# Patient Record
Sex: Female | Born: 2000 | Hispanic: No | Marital: Single | Smoking: Never smoker
Health system: Southern US, Community
[De-identification: ages and names within clinical notes are randomized; demographics above are authoritative.]

## PROBLEM LIST (undated history)

## (undated) DIAGNOSIS — Z789 Other specified health status: Secondary | ICD-10-CM

## (undated) HISTORY — DX: Other specified health status: Z78.9

---
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
## 2019-05-02 ENCOUNTER — Other Ambulatory Visit: Payer: Self-pay | Admitting: *Deleted

## 2019-05-02 DIAGNOSIS — Z20822 Contact with and (suspected) exposure to covid-19: Secondary | ICD-10-CM

## 2019-05-02 NOTE — Progress Notes (Signed)
la 

## 2019-05-05 LAB — NOVEL CORONAVIRUS, NAA: SARS-CoV-2, NAA: DETECTED — AB

## 2020-02-05 DIAGNOSIS — Z20828 Contact with and (suspected) exposure to other viral communicable diseases: Secondary | ICD-10-CM | POA: Diagnosis not present

## 2020-02-05 DIAGNOSIS — Z03818 Encounter for observation for suspected exposure to other biological agents ruled out: Secondary | ICD-10-CM | POA: Diagnosis not present

## 2020-03-20 DIAGNOSIS — Z30013 Encounter for initial prescription of injectable contraceptive: Secondary | ICD-10-CM | POA: Diagnosis not present

## 2020-03-20 DIAGNOSIS — Z3009 Encounter for other general counseling and advice on contraception: Secondary | ICD-10-CM | POA: Diagnosis not present

## 2020-03-20 DIAGNOSIS — Z3202 Encounter for pregnancy test, result negative: Secondary | ICD-10-CM | POA: Diagnosis not present

## 2020-03-20 DIAGNOSIS — Z1388 Encounter for screening for disorder due to exposure to contaminants: Secondary | ICD-10-CM | POA: Diagnosis not present

## 2020-03-20 DIAGNOSIS — Z113 Encounter for screening for infections with a predominantly sexual mode of transmission: Secondary | ICD-10-CM | POA: Diagnosis not present

## 2020-03-20 DIAGNOSIS — Z0389 Encounter for observation for other suspected diseases and conditions ruled out: Secondary | ICD-10-CM | POA: Diagnosis not present

## 2020-04-16 DIAGNOSIS — A5600 Chlamydial infection of lower genitourinary tract, unspecified: Secondary | ICD-10-CM | POA: Diagnosis not present

## 2020-05-29 DIAGNOSIS — Z20822 Contact with and (suspected) exposure to covid-19: Secondary | ICD-10-CM | POA: Diagnosis not present

## 2020-05-29 DIAGNOSIS — Z03818 Encounter for observation for suspected exposure to other biological agents ruled out: Secondary | ICD-10-CM | POA: Diagnosis not present

## 2021-08-20 DIAGNOSIS — Z3009 Encounter for other general counseling and advice on contraception: Secondary | ICD-10-CM | POA: Diagnosis not present

## 2021-08-20 DIAGNOSIS — Z3200 Encounter for pregnancy test, result unknown: Secondary | ICD-10-CM | POA: Diagnosis not present

## 2021-09-09 ENCOUNTER — Ambulatory Visit (INDEPENDENT_AMBULATORY_CARE_PROVIDER_SITE_OTHER): Payer: Medicaid Other

## 2021-09-09 DIAGNOSIS — Z3400 Encounter for supervision of normal first pregnancy, unspecified trimester: Secondary | ICD-10-CM | POA: Insufficient documentation

## 2021-09-09 DIAGNOSIS — Z3402 Encounter for supervision of normal first pregnancy, second trimester: Secondary | ICD-10-CM

## 2021-09-09 MED ORDER — GOJJI WEIGHT SCALE MISC: 1.0000 | 0 refills | Status: DC

## 2021-09-09 MED ORDER — BLOOD PRESSURE KIT DEVI: 1.0000 | 0 refills | Status: DC

## 2021-09-09 NOTE — Progress Notes (Signed)
Patient was assessed and managed by nursing staff during this encounter. I have reviewed the chart and agree with the documentation and plan. I have also made any necessary editorial changes.  Catalina Antigua, MD 09/09/2021 9:21 AM

## 2021-09-09 NOTE — Progress Notes (Signed)
New OB Intake  I connected with  Vanessa Noble on 09/09/21 at  9:00 AM EST by telephone Video Visit and verified that I am speaking with the correct person using two identifiers. Nurse is located at [facility] and pt is located at Home.  I discussed the limitations, risks, security and privacy concerns of performing an evaluation and management service by telephone and the availability of in person appointments. I also discussed with the patient that there may be a patient responsible charge related to this service. The patient expressed understanding and agreed to proceed.  I explained I am completing New OB Intake today. We discussed her EDD of 02/24/22 that is based on LMP of 05/20/21. Pt is G1/P0. I reviewed her allergies, medications, Medical/Surgical/OB history, and appropriate screenings. I informed her of [facility] services. Based on history, this is a/an  pregnancy uncomplicated .   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   MyChart/Babyscripts MyChart access verified. I explained pt will have some visits in office and some virtually. Babyscripts instructions given and order placed. Patient verifies receipt of registration text/e-mail. Account successfully created and app downloaded.  Blood Pressure Cuff  Blood pressure cuff ordered for patient to pick-up from [employer]. Explained after first prenatal appt pt will check weekly and document in Babyscripts.  Weight scale: Patient    have weight scale. Weight scale ordered for patient to pick up form [facility].   Anatomy US Explained first scheduled [place] will be around 19 weeks. Anatomy US will be scheduled at NEW [facility] Provider visit.  Labs Discussed Avelina Laine genetic screening with patient. Would like both Panorama and Horizon drawn at new OB  visit. Routine prenatal labs needed.  Covid Vaccine Patient has covid vaccine.   Centering in Pregnancy Candidate?  If yes, offer as possibility  Mother/ Baby Dyad Candidate?    If yes, offer as possibility  Informed patient of [facility] website  and placed link in her AVS.   Social Determinants of Health Food Insecurity: Patient denies food insecurity. WIC Referral: Patient is interested in referral to [facility].  Transportation: Patient denies transportation needs. Childcare: Discussed no children allowed at ultrasound appointments. Offered childcare services; patient declines childcare services at this time.  Send link to Pregnancy Navigators   Placed OB Box on problem list and updated  First visit review I reviewed new OB appt with pt. I explained she will have a pelvic exam, ob bloodwork with genetic screening, and PAP smear. Explained pt will be seen by Coral Ceo at first visit; encounter routed to appropriate provider. Explained that patient will be seen by pregnancy navigator following visit with provider. [facility] information placed in AVS.   Hamilton Capri, RN 09/09/2021  8:56 AM

## 2021-09-16 ENCOUNTER — Other Ambulatory Visit: Payer: Self-pay

## 2021-09-16 ENCOUNTER — Other Ambulatory Visit (HOSPITAL_COMMUNITY)
Admission: RE | Admit: 2021-09-16 | Discharge: 2021-09-16 | Disposition: A | Discharge status: Home / Self Care | Payer: Medicaid Other | Source: Ambulatory Visit | Attending: Obstetrics | Admitting: Obstetrics

## 2021-09-16 ENCOUNTER — Encounter: Payer: Self-pay | Admitting: Obstetrics

## 2021-09-16 ENCOUNTER — Ambulatory Visit (INDEPENDENT_AMBULATORY_CARE_PROVIDER_SITE_OTHER): Payer: Medicaid Other | Admitting: Obstetrics

## 2021-09-16 ENCOUNTER — Ambulatory Visit (INDEPENDENT_AMBULATORY_CARE_PROVIDER_SITE_OTHER): Payer: Medicaid Other

## 2021-09-16 VITALS — BP 117/80 | HR 90 | Wt 124.3 lb

## 2021-09-16 DIAGNOSIS — O26841 Uterine size-date discrepancy, first trimester: Secondary | ICD-10-CM | POA: Diagnosis not present

## 2021-09-16 DIAGNOSIS — Z3A12 12 weeks gestation of pregnancy: Secondary | ICD-10-CM

## 2021-09-16 DIAGNOSIS — Z3685 Encounter for antenatal screening for Streptococcus B: Secondary | ICD-10-CM | POA: Diagnosis not present

## 2021-09-16 DIAGNOSIS — Z3402 Encounter for supervision of normal first pregnancy, second trimester: Secondary | ICD-10-CM | POA: Diagnosis not present

## 2021-09-16 DIAGNOSIS — Z23 Encounter for immunization: Secondary | ICD-10-CM | POA: Diagnosis not present

## 2021-09-16 DIAGNOSIS — O26842 Uterine size-date discrepancy, second trimester: Secondary | ICD-10-CM

## 2021-09-16 DIAGNOSIS — Z3480 Encounter for supervision of other normal pregnancy, unspecified trimester: Secondary | ICD-10-CM | POA: Diagnosis not present

## 2021-09-16 DIAGNOSIS — Z3482 Encounter for supervision of other normal pregnancy, second trimester: Secondary | ICD-10-CM | POA: Diagnosis not present

## 2021-09-16 DIAGNOSIS — Z3401 Encounter for supervision of normal first pregnancy, first trimester: Secondary | ICD-10-CM | POA: Diagnosis not present

## 2021-09-16 NOTE — Progress Notes (Signed)
Pt presents for NOB visit c/o N&V.  This is not a planned pregnancy but FOB is involved, not living together.

## 2021-09-16 NOTE — Progress Notes (Signed)
Subjective:    Vanessa Noble is being seen today for her first obstetrical visit.  This is not a planned pregnancy. She is at [redacted]w[redacted]d gestation. Her obstetrical history is significant for  none . Relationship with FOB: significant other, not living together but supportive. Patient does intend to breast feed. Pregnancy history fully reviewed.  The information documented in the HPI was reviewed and verified.  Menstrual History: OB History     Gravida  1   Para      Term      Preterm      AB      Living         SAB      IAB      Ectopic      Multiple      Live Births               Patient's last menstrual period was 05/20/2021.    Past Medical History:  Diagnosis Date   Medical history non-contributory     History reviewed. No pertinent surgical history.  (Not in a hospital admission)  Allergies  Allergen Reactions   Penicillins Hives    Social History   Tobacco Use   Smoking status: Never   Smokeless tobacco: Never  Substance Use Topics   Alcohol use: Not Currently    Comment: not since confirmed pregnancy    Family History  Problem Relation Age of Onset   Hypertension Father    Diabetes Father      Review of Systems Constitutional: negative for weight loss Gastrointestinal: negative for vomiting Genitourinary:negative for genital lesions and vaginal discharge and dysuria Musculoskeletal:negative for back pain Behavioral/Psych: negative for abusive relationship, depression, illegal drug usage and tobacco use    Objective:    BP 117/80   Pulse 90   Wt 124 lb 4.8 oz (56.4 kg)   LMP 05/20/2021  General Appearance:    Alert, cooperative, no distress, appears stated age  Head:    Normocephalic, without obvious abnormality, atraumatic  Eyes:    PERRL, conjunctiva/corneas clear, EOM's intact, fundi    benign, both eyes  Ears:    Normal TM's and external ear canals, both ears  Nose:   Nares normal, septum midline, mucosa normal, no  drainage    or sinus tenderness  Throat:   Lips, mucosa, and tongue normal; teeth and gums normal  Neck:   Supple, symmetrical, trachea midline, no adenopathy;    thyroid:  no enlargement/tenderness/nodules; no carotid   bruit or JVD  Back:     Symmetric, no curvature, ROM normal, no CVA tenderness  Lungs:     Clear to auscultation bilaterally, respirations unlabored  Chest Wall:    No tenderness or deformity   Heart:    Regular rate and rhythm, S1 and S2 normal, no murmur, rub   or gallop  Breast Exam:    No tenderness, masses, or nipple abnormality  Abdomen:     Soft, non-tender, bowel sounds active all four quadrants,    no masses, no organomegaly  Genitalia:    Normal female without lesion, discharge or tenderness  Extremities:   Extremities normal, atraumatic, no cyanosis or edema  Pulses:   2+ and symmetric all extremities  Skin:   Skin color, texture, turgor normal, no rashes or lesions  Lymph nodes:   Cervical, supraclavicular, and axillary nodes normal  Neurologic:   CNII-XII intact, normal strength, sensation and reflexes    throughout  Lab Review Urine pregnancy test Labs reviewed yes Radiologic studies reviewed no  I have spent a total of 20 minutes of face-to-face time, excluding clinical staff time, reviewing notes and preparing to see patient, ordering tests and/or medications, and counseling the patient.   Assessment:    1. Encounter for supervision of normal first pregnancy in second trimester Rx: - Genetic Screening - Cervicovaginal ancillary only - CBC/D/Plt+RPR+Rh+ABO+RubIgG... - Culture, OB Urine - [place] MFM OB COMP + 14 WK; Future - Flu Vaccine QUAD 36+ mos IM (Fluarix, Quad PF) - US OB Limited; Future  2. Fundal height low for dates in second trimester Rx: - US OB Limited; Future    Plan:    1. Encounter for supervision of normal first pregnancy in second trimester Rx: - AFP, Serum, Open Spina Bifida - Genetic Screening - Cervicovaginal  ancillary only - CBC/D/Plt+RPR+Rh+ABO+RubIgG... - Culture, OB Urine - [place] MFM OB COMP + 14 WK; Future - Flu Vaccine QUAD 36+ mos IM (Fluarix, Quad PF) - US OB Limited; Future  2. Fundal height low for dates in second trimester Rx: - US OB Limited; Future   Prenatal vitamins.  Counseling provided regarding continued use of seat belts, cessation of alcohol consumption, smoking or use of illicit drugs; infection precautions i.e., influenza/TDAP immunizations, toxoplasmosis,CMV, parvovirus, listeria and varicella; workplace safety, exercise during pregnancy; routine dental care, safe medications, sexual activity, hot tubs, saunas, pools, travel, caffeine use, fish and methlymercury, potential toxins, hair treatments, varicose veins Weight gain recommendations per IOM guidelines reviewed: underweight/BMI< 18.5--> gain 28 - 40 lbs; normal weight/BMI 18.5 - 24.9--> gain 25 - 35 lbs; overweight/BMI 25 - 29.9--> gain 15 - 25 lbs; obese/BMI >30->gain  11 - 20 lbs Problem list reviewed and updated. FIRST/CF mutation testing/NIPT/QUAD SCREEN/fragile X/Ashkenazi Jewish population testing/Spinal muscular atrophy discussed: requested. Role of ultrasound in pregnancy discussed; fetal survey: requested. Amniocentesis discussed: not indicated.   Orders Placed This Encounter  Procedures   Culture, OB Urine   [place] MFM OB COMP + 14 WK    Standing Status:   Future    Standing Expiration Date:   09/16/2022    Order Specific Question:   Reason for Exam (SYMPTOM  OR DIAGNOSIS REQUIRED)    Answer:   anatomy    Order Specific Question:   Preferred Location    Answer:   [facility] Ultrasound   US OB Limited    Standing Status:   Future    Number of Occurrences:   1    Standing Expiration Date:   09/16/2022    Order Specific Question:   Reason for Exam (SYMPTOM  OR DIAGNOSIS REQUIRED)    Answer:   Dating. Fundal height not consistent with LMP    Order Specific Question:   Preferred Imaging Location?    Answer:    External   Flu Vaccine QUAD 36+ mos IM (Fluarix, Quad PF)   AFP, Serum, Open Spina Bifida    Order Specific Question:   Is patient insulin dependent?    Answer:   No    Order Specific Question:   Patient weight (lb.)    Answer:   124lb    Order Specific Question:   Gestational Age (GA), weeks    Answer:   17    Order Specific Question:   Date on which patient was at this GA    Answer:   09/16/2021    Order Specific Question:   GA Calculation Method    Answer:   LMP  Order Specific Question:   Number of fetuses    Answer:   0    Order Specific Question:   Reason for screen    Answer:   OTHER    Comments:   routine     Order Specific Question:   Donor egg?    Answer:   N    Order Specific Question:   Age of egg donor?    Answer:   0   Genetic Screening    PANORAMA   CBC/D/Plt+RPR+Rh+ABO+RubIgG...    Follow up in 4 weeks.   Brock Bad, MD 09/16/2021 3:21 PM

## 2021-09-17 ENCOUNTER — Other Ambulatory Visit: Payer: Self-pay | Admitting: *Deleted

## 2021-09-17 ENCOUNTER — Other Ambulatory Visit (HOSPITAL_COMMUNITY)
Admission: RE | Admit: 2021-09-17 | Discharge: 2021-09-17 | Disposition: A | Discharge status: Home / Self Care | Payer: Medicaid Other | Source: Ambulatory Visit | Attending: Obstetrics | Admitting: Obstetrics

## 2021-09-17 DIAGNOSIS — Z3402 Encounter for supervision of normal first pregnancy, second trimester: Secondary | ICD-10-CM

## 2021-09-17 LAB — CERVICOVAGINAL ANCILLARY ONLY
Bacterial Vaginitis (gardnerella): POSITIVE — AB
Candida Glabrata: NEGATIVE
Candida Vaginitis: NEGATIVE
Chlamydia: NEGATIVE
Comment: NEGATIVE
Comment: NEGATIVE
Comment: NEGATIVE
Comment: NEGATIVE
Comment: NEGATIVE
Comment: NORMAL
Neisseria Gonorrhea: NEGATIVE
Trichomonas: NEGATIVE

## 2021-09-17 NOTE — Progress Notes (Signed)
Lab order entered for pap- wasn't ordered at visit but specimen in lab

## 2021-09-18 LAB — CBC/D/PLT+RPR+RH+ABO+RUBIGG...
Antibody Screen: NEGATIVE
Basophils Absolute: 0 10*3/uL (ref 0.0–0.2)
Basos: 0 %
EOS (ABSOLUTE): 0 10*3/uL (ref 0.0–0.4)
Eos: 0 %
HCV Ab: <0.1 s/co ratio (ref 0.0–0.9)
HIV Screen 4th Generation wRfx: NONREACTIVE
Hematocrit: 37.5 % (ref 34.0–46.6)
Hemoglobin: 12.6 g/dL (ref 11.1–15.9)
Hepatitis B Surface Ag: NEGATIVE
Immature Grans (Abs): 0 10*3/uL (ref 0.0–0.1)
Immature Granulocytes: 0 %
Lymphocytes Absolute: 2.1 10*3/uL (ref 0.7–3.1)
Lymphs: 21 %
MCH: 29.7 pg (ref 26.6–33.0)
MCHC: 33.6 g/dL (ref 31.5–35.7)
MCV: 88 fL (ref 79–97)
Monocytes Absolute: 0.6 10*3/uL (ref 0.1–0.9)
Monocytes: 6 %
Neutrophils Absolute: 7.4 10*3/uL — ABNORMAL HIGH (ref 1.4–7.0)
Neutrophils: 73 %
Platelets: 260 10*3/uL (ref 150–450)
RBC: 4.24 x10E6/uL (ref 3.77–5.28)
RDW: 12.5 % (ref 11.7–15.4)
RPR Ser Ql: NONREACTIVE
Rh Factor: POSITIVE
Rubella Antibodies, IGG: 1.74 index (ref >0.99)
WBC: 10.1 10*3/uL (ref 3.4–10.8)

## 2021-09-18 LAB — AFP, SERUM, OPEN SPINA BIFIDA
AFP MoM: 0.28
AFP Value: 12.7 ng/mL
Gest. Age on Collection Date: 17 weeks
Maternal Age At EDD: 21.7 yr
OSBR Risk 1 IN: 10000
Test Results:: NEGATIVE
Weight: 124 [lb_av]

## 2021-09-18 LAB — URINE CULTURE, OB REFLEX

## 2021-09-18 LAB — HCV INTERPRETATION

## 2021-09-18 LAB — CULTURE, OB URINE

## 2021-09-22 ENCOUNTER — Other Ambulatory Visit: Payer: Self-pay | Admitting: Obstetrics

## 2021-09-22 DIAGNOSIS — B9689 Other specified bacterial agents as the cause of diseases classified elsewhere: Secondary | ICD-10-CM

## 2021-09-22 LAB — CYTOLOGY - PAP

## 2021-09-22 MED ORDER — METRONIDAZOLE 500 MG PO TABS: 500.0000 mg | ORAL_TABLET | Freq: Two times a day (BID) | ORAL | 2 refills | Status: DC

## 2021-09-24 ENCOUNTER — Encounter: Payer: Self-pay | Admitting: Obstetrics and Gynecology

## 2021-09-24 ENCOUNTER — Telehealth: Payer: Self-pay

## 2021-09-24 NOTE — Telephone Encounter (Addendum)
Spoke with pt and let her know that pap showed LGSIL and that she needed to schedule a Colposcopy. Pt states she spoke to office staff yesterday and was told she didn't need the Colposcopy and that she could just repeat the pap smear in a year. I don't see any documentation of this so I have sent staff message to Charlsie Quest, CMA (who previously attempted to call her) and Dr.Harper to get clarification on this.   ----- Message from Brock Bad, MD sent at 09/23/2021  8:55 AM EST ----- LGSIL pap smear.  Please schedule colposcopy.

## 2021-09-26 ENCOUNTER — Encounter: Payer: Self-pay | Admitting: Obstetrics and Gynecology

## 2021-09-26 DIAGNOSIS — R87612 Low grade squamous intraepithelial lesion on cytologic smear of cervix (LGSIL): Secondary | ICD-10-CM | POA: Insufficient documentation

## 2021-10-15 ENCOUNTER — Other Ambulatory Visit: Payer: Self-pay

## 2021-10-15 ENCOUNTER — Ambulatory Visit (INDEPENDENT_AMBULATORY_CARE_PROVIDER_SITE_OTHER): Payer: Medicaid Other | Admitting: Women's Health

## 2021-10-15 VITALS — BP 117/64 | HR 79 | Wt 124.0 lb

## 2021-10-15 DIAGNOSIS — Z3A17 17 weeks gestation of pregnancy: Secondary | ICD-10-CM

## 2021-10-15 DIAGNOSIS — Z3402 Encounter for supervision of normal first pregnancy, second trimester: Secondary | ICD-10-CM

## 2021-10-15 DIAGNOSIS — R87612 Low grade squamous intraepithelial lesion on cytologic smear of cervix (LGSIL): Secondary | ICD-10-CM

## 2021-10-15 NOTE — Patient Instructions (Signed)
Maternity Assessment Unit (MAU)  The Maternity Assessment Unit (MAU) is located at the [facility] [address] [facility]. The address is: [address] [place], [place], [place] [address]. Please see map below for additional directions.    The Maternity Assessment Unit is designed to help you during your pregnancy, and for up to 6 weeks after delivery, with any pregnancy- or postpartum-related emergencies, if you think you are in labor, or if your water has broken. For example, if you experience nausea and vomiting, vaginal bleeding, severe abdominal or pelvic pain, elevated blood pressure or other problems related to your pregnancy or postpartum time, please come to the Maternity Assessment Unit for assistance.       Second Trimester of Pregnancy The second trimester of pregnancy is from week 13 through week 27. This is months 4 through 6 of pregnancy. The second trimester is often a time when you feel your best. Your body has adjusted to being pregnant, and you begin to feel better physically. During the second trimester: Morning sickness has lessened or stopped completely. You may have more energy. You may have an increase in appetite. The second trimester is also a time when the unborn baby (fetus) is growing rapidly. At the end of the sixth month, the fetus may be up to 12 inches long and weigh about 1 pounds. You will likely begin to feel the baby move (quickening) between 16 and 20 weeks of pregnancy. Body changes during your second trimester Your body continues to go through many changes during your second trimester. The changes vary and generally return to normal after the baby is born. Physical changes Your weight will continue to increase. You will notice your lower abdomen bulging out. You may begin to get stretch marks on your hips, abdomen, and breasts. Your breasts will continue to grow and to become tender. Dark spots or blotches (chloasma or  mask of pregnancy) may develop on your face. A dark line from your belly button to the pubic area (linea nigra) may appear. You may have changes in your hair. These can include thickening of your hair, rapid growth, and changes in texture. Some people also have hair loss during or after pregnancy, or hair that feels dry or thin. Health changes You may develop headaches. You may have heartburn. You may develop constipation. You may develop hemorrhoids or swollen, bulging veins (varicose veins). Your gums may bleed and may be sensitive to brushing and flossing. You may urinate more often because the fetus is pressing on your bladder. You may have back pain. This is caused by: Weight gain. Pregnancy hormones that are relaxing the joints in your pelvis. A shift in weight and the muscles that support your balance. Follow these instructions at home: Medicines Follow your health care provider's instructions regarding medicine use. Specific medicines may be either safe or unsafe to take during pregnancy. Do not take any medicines unless approved by your health care provider. Take a prenatal vitamin that contains at least 600 micrograms (mcg) of folic acid. Eating and drinking Eat a healthy diet that includes fresh fruits and vegetables, whole grains, good sources of protein such as meat, eggs, or tofu, and low-fat dairy products. Avoid raw meat and unpasteurized juice, milk, and cheese. These carry germs that can harm you and your baby. You may need to take these actions to prevent or treat constipation: Drink enough fluid to keep your urine pale yellow. Eat foods that are high in fiber, such  as beans, whole grains, and fresh fruits and vegetables. Limit foods that are high in fat and processed sugars, such as fried or sweet foods. Activity Exercise only as directed by your health care provider. Most people can continue their usual exercise routine during pregnancy. Try to exercise for 30 minutes  at least 5 days a week. Stop exercising if you develop contractions in your uterus. Stop exercising if you develop pain or cramping in the lower abdomen or lower back. Avoid exercising if it is very hot or humid or if you are at a high altitude. Avoid heavy lifting. If you choose to, you may have sex unless your health care provider tells you not to. Relieving pain and discomfort Wear a supportive bra to prevent discomfort from breast tenderness. Take warm sitz baths to soothe any pain or discomfort caused by hemorrhoids. Use hemorrhoid cream if your health care provider approves. Rest with your legs raised (elevated) if you have leg cramps or low back pain. If you develop varicose veins: Wear support hose as told by your health care provider. Elevate your feet for 15 minutes, 3-4 times a day. Limit salt in your diet. Safety Wear your seat belt at all times when driving or riding in a car. Talk with your health care provider if someone is verbally or physically abusive to you. Lifestyle Do not use hot tubs, steam rooms, or saunas. Do not douche. Do not use tampons or scented sanitary pads. Avoid cat litter boxes and soil used by cats. These carry germs that can cause birth defects in the baby and possibly loss of the fetus by miscarriage or stillbirth. Do not use herbal remedies, alcohol, illegal drugs, or medicines that are not approved by your health care provider. Chemicals in these products can harm your baby. Do not use any products that contain nicotine or tobacco, such as cigarettes, e-cigarettes, and chewing tobacco. If you need help quitting, ask your health care provider. General instructions During a routine prenatal visit, your health care provider will do a physical exam and other tests. He or she will also discuss your overall health. Keep all follow-up visits. This is important. Ask your health care provider for a referral to a local prenatal education class. Ask for help if  you have counseling or nutritional needs during pregnancy. Your health care provider can offer advice or refer you to specialists for help with various needs. Where to find more information American Pregnancy Association: americanpregnancy.org [employer] of [employer]stetricians and Gynecologists: https://www.todd-brady.net/ Office on [employer] Health: MightyReward.co.nz Contact a health care provider if you have: A headache that does not go away when you take medicine. Vision changes or you see spots in front of your eyes. Mild pelvic cramps, pelvic pressure, or nagging pain in the abdominal area. Persistent nausea, vomiting, or diarrhea. A bad-smelling vaginal discharge or foul-smelling urine. Pain when you urinate. Sudden or extreme swelling of your face, hands, ankles, feet, or legs. A fever. Get help right away if you: Have fluid leaking from your vagina. Have spotting or bleeding from your vagina. Have severe abdominal cramping or pain. Have difficulty breathing. Have chest pain. Have fainting spells. Have not felt your baby move for the time period told by your health care provider. Have new or increased pain, swelling, or redness in an arm or leg. Summary The second trimester of pregnancy is from week 13 through week 27 (months 4 through 6). Do not use herbal remedies, alcohol, illegal drugs, or medicines that are not  approved by your health care provider. Chemicals in these products can harm your baby. Exercise only as directed by your health care provider. Most people can continue their usual exercise routine during pregnancy. Keep all follow-up visits. This is important. This information is not intended to replace advice given to you by your health care provider. Make sure you discuss any questions you have with your health care provider. Document Revised: 03/20/2020 Document Reviewed: 01/25/2020 Elsevier Patient Education  2022 [employer].       Contraception Choices Contraception, also called birth control, refers to methods or devices that prevent pregnancy. Hormonal methods Contraceptive implant A contraceptive implant is a thin, plastic tube that contains a hormone that prevents pregnancy. It is different from an intrauterine device (IUD). It is inserted into the upper part of the arm by a health care provider. Implants can be effective for up to 3 years. Progestin-only injections Progestin-only injections are injections of progestin, a synthetic form of the hormone progesterone. They are given every 3 months by a health care provider. Birth control pills Birth control pills are pills that contain hormones that prevent pregnancy. They must be taken once a day, preferably at the same time each day. A prescription is needed to use this method of contraception. Birth control patch The birth control patch contains hormones that prevent pregnancy. It is placed on the skin and must be changed once a week for three weeks and removed on the fourth week. A prescription is needed to use this method of contraception. Vaginal ring A vaginal ring contains hormones that prevent pregnancy. It is placed in the vagina for three weeks and removed on the fourth week. After that, the process is repeated with a new ring. A prescription is needed to use this method of contraception. Emergency contraceptive Emergency contraceptives prevent pregnancy after unprotected sex. They come in pill form and can be taken up to 5 days after sex. They work best the sooner they are taken after having sex. Most emergency contraceptives are available without a prescription. This method should not be used as your only form of birth control. Barrier methods Female condom A female condom is a thin sheath that is worn over the penis during sex. Condoms keep sperm from going inside a woman's body. They can be used with a sperm-killing substance (spermicide) to  increase their effectiveness. They should be thrown away after one use. Female condom A female condom is a soft, loose-fitting sheath that is put into the vagina before sex. The condom keeps sperm from going inside a woman's body. They should be thrown away after one use. Diaphragm A diaphragm is a soft, dome-shaped barrier. It is inserted into the vagina before sex, along with a spermicide. The diaphragm blocks sperm from entering the uterus, and the spermicide kills sperm. A diaphragm should be left in the vagina for 6-8 hours after sex and removed within 24 hours. A diaphragm is prescribed and fitted by a health care provider. A diaphragm should be replaced every 1-2 years, after giving birth, after gaining more than 15 lb (6.8 kg), and after pelvic surgery. Cervical cap A cervical cap is a round, soft latex or plastic cup that fits over the cervix. It is inserted into the vagina before sex, along with spermicide. It blocks sperm from entering the uterus. The cap should be left in place for 6-8 hours after sex and removed within 48 hours. A cervical cap must be prescribed and fitted by a health care  provider. It should be replaced every 2 years. Sponge A sponge is a soft, circular piece of polyurethane foam with spermicide in it. The sponge helps block sperm from entering the uterus, and the spermicide kills sperm. To use it, you make it wet and then insert it into the vagina. It should be inserted before sex, left in for at least 6 hours after sex, and removed and thrown away within 30 hours. Spermicides Spermicides are chemicals that kill or block sperm from entering the cervix and uterus. They can come as a cream, jelly, suppository, foam, or tablet. A spermicide should be inserted into the vagina with an applicator at least 10-15 minutes before sex to allow time for it to work. The process must be repeated every time you have sex. Spermicides do not require a prescription. Intrauterine  contraception Intrauterine device (IUD) An IUD is a T-shaped device that is put in a woman's uterus. There are two types: Hormone IUD.This type contains progestin, a synthetic form of the hormone progesterone. This type can stay in place for 3-5 years. Copper IUD.This type is wrapped in copper wire. It can stay in place for 10 years. Permanent methods of contraception Female tubal ligation In this method, a woman's fallopian tubes are sealed, tied, or blocked during surgery to prevent eggs from traveling to the uterus. Hysteroscopic sterilization In this method, a small, flexible insert is placed into each fallopian tube. The inserts cause scar tissue to form in the fallopian tubes and block them, so sperm cannot reach an egg. The procedure takes about 3 months to be effective. Another form of birth control must be used during those 3 months. Female sterilization This is a procedure to tie off the tubes that carry sperm (vasectomy). After the procedure, the man can still ejaculate fluid (semen). Another form of birth control must be used for 3 months after the procedure. Natural planning methods Natural family planning In this method, a couple does not have sex on days when the woman could become pregnant. Calendar method In this method, the woman keeps track of the length of each menstrual cycle, identifies the days when pregnancy can happen, and does not have sex on those days. Ovulation method In this method, a couple avoids sex during ovulation. Symptothermal method This method involves not having sex during ovulation. The woman typically checks for ovulation by watching changes in her temperature and in the consistency of cervical mucus. Post-ovulation method In this method, a couple waits to have sex until after ovulation. Where to find more information Centers for Disease Control and Prevention: FootballExhibition.com.br Summary Contraception, also called birth control, refers to methods or devices  that prevent pregnancy. Hormonal methods of contraception include implants, injections, pills, patches, vaginal rings, and emergency contraceptives. Barrier methods of contraception can include female condoms, female condoms, diaphragms, cervical caps, sponges, and spermicides. There are two types of IUDs (intrauterine devices). An IUD can be put in a woman's uterus to prevent pregnancy for 3-5 years. Permanent sterilization can be done through a procedure for males and females. Natural family planning methods involve nothaving sex on days when the woman could become pregnant. This information is not intended to replace advice given to you by your health care provider. Make sure you discuss any questions you have with your health care provider. Document Revised: 03/18/2020 Document Reviewed: 03/18/2020 Elsevier Patient Education  2022 Elsevier Inc.       AREA PEDIATRIC/FAMILY PRACTICE PHYSICIANS  [facility] [address]  [address] [place], [place] [address] Phone - [phone]   Fax - [phone]  JACK AMOS [address] [place], [place]  [address] Phone - [phone]   Fax - [phone]  [facility] [address] [place], [place]  [address] Phone - [phone]   Fax - [phone]  [facility] [address] [place], [place]  [address] Phone - [phone]   Fax - [phone]  [facility] [address] [place], [place]  [address] Phone - [phone]   Fax - [phone]  [facility] [address] [place], [place]  [address] Phone - [phone]   Fax - [phone]  [facility] [place] [address] [place], [place]  [address] Phone - [phone]   Fax - [phone]  [facility] AT [facility] [address] [place], [address] [place], [place]  [address] Phone - [phone]   Fax - [phone]  [facility]  AT [facility] [address] [place], [place]  [address] Phone - [phone]   Fax - [phone] [facility] AT [place] [address] [place], [place]  [address] Phone - [phone]   Fax - [phone]  [facility] [address] [place], [place]  [address] Phone - [phone]   Fax - [phone]  [facility] [address], [address] [place], [place]  [address] Phone - [phone]   Fax - [phone]  [facility] [place] [address] [place], [place]  [address] Phone - [phone]   Fax - [phone]  [facility] [address] [place], [place]  [address] Phone - [phone]  [facility] [address] [place], [place]  [address] Phone - [phone]   Fax - [phone]  [facility] [address] [place], [place]  [address] Phone - [phone]   Fax - [phone]  [facility] [address] [place], [place]  [address] Phone - [phone]   Fax - [phone]  [facility] [address] [place], [place]  [address] Phone - [phone]   Fax - [phone]   [facility] [address] [place] [address] [place], [place]  [address] Phone - [phone]   Fax - [phone]  [facility] [address] [place], [place]  [address] Phone - [phone]   Fax - [phone]  DAVID RUBIN [address] [place], [place]  [address] Phone - [phone]   Fax - [phone]  [facility] [address] [place], [place]  [address] Phone - [phone]   Fax - [phone]  [place] [facility] Alita Chyle [address] [place], [place]  [address] Phone - [phone]   Fax - [phone] Gerarda Fraction [address] [place], [place]  [address] Phone - [phone]   Fax - [phone]  [facility]  [place] [address] [place], [place]  [address] Phone - [phone]   Fax - [phone]  [facility] - [place] [address] [place], [place]  [address] Phone - [phone]   Fax - [phone]        Childbirth Education Options: [facility] Department Classes:  Childbirth education classes can help you get ready for a positive parenting experience. You can also meet other expectant parents and get free stuff for your baby. Each class runs for  five weeks on the same night and costs $45 for the mother-to-be and her support person. Medicaid covers the cost if you are eligible. Call [phone] to register. [facility] Childbirth Education: Classes can vary in availability and schedule is subject to change. For most up-to-date information please visit [email or web address] to review and register.

## 2021-10-15 NOTE — Progress Notes (Signed)
Subjective:  Vanessa Noble is a 20 y.o. G1P0 at [redacted]w[redacted]d being seen today for ongoing prenatal care.  She is currently monitored for the following issues for this low-risk pregnancy and has Dysmenorrhea in adolescent; Acne vulgaris; Dyshydrosis; Supervision of normal first pregnancy; and LGSIL on Pap smear of cervix on their problem list.  Patient reports no complaints.  Contractions: Not present. Vag. Bleeding: None.   . Denies leaking of fluid.   The following portions of the patient's history were reviewed and updated as appropriate: allergies, current medications, past family history, past medical history, past social history, past surgical history and problem list. Problem list updated.  Objective:   Vitals:   10/15/21 0931  BP: 117/64  Pulse: 79  Weight: 124 lb (56.2 kg)    Fetal Status: Fetal Heart Rate (bpm): 160         General:  Alert, oriented and cooperative. Patient is in no acute distress.  Skin: Skin is warm and dry. No rash noted.   Cardiovascular: Normal heart rate noted  Respiratory: Normal respiratory effort, no problems with respiration noted  Abdomen: Soft, gravid, appropriate for gestational age. Pain/Pressure: Present     Pelvic: Vag. Bleeding: None     Cervical exam deferred        Extremities: Normal range of motion.  Edema: None  Mental Status: Normal mood and affect. Normal behavior. Normal judgment and thought content.   Urinalysis:      Assessment and Plan:  Pregnancy: G1P0 at 110w0d  1. Encounter for supervision of normal first pregnancy in second trimester -info given on contraception -peds list given -CBE info given  PHQ9 SCORE ONLY 09/09/2021  PHQ-9 Total Score 1   GAD 7 : Generalized Anxiety Score 09/09/2021  Nervous, Anxious, on Edge 0  Control/stop worrying 0  Worry too much - different things 0  Trouble relaxing 0  Restless 0  Easily annoyed or irritable 0  Afraid - awful might happen 0  Total GAD 7 Score 0   2. LGSIL on Pap  smear of cervix -repeat one year  3. [redacted] weeks gestation of pregnancy  Preterm labor symptoms and general obstetric precautions including but not limited to vaginal bleeding, contractions, leaking of fluid and fetal movement were reviewed in detail with the patient. I discussed the assessment and treatment plan with the patient. The patient was provided an opportunity to ask questions and all were answered. The patient agreed with the plan and demonstrated an understanding of the instructions. The patient was advised to call back or seek an in-person office evaluation/go to [facility] at [facility] for any urgent or concerning symptoms. Please refer to After Visit Summary for other counseling recommendations.  Return in about 4 weeks (around 11/12/2021) for in-person LOB/APP OK.   [place]anyon Mcginness, Odie Sera, NP

## 2021-10-26 NOTE — L&D Delivery Note (Cosign Needed)
OB/GYN Faculty Practice Delivery Note  Vanessa Noble is a 21 y.o. G1P1001 s/p VD at [redacted]w[redacted]d. She was admitted for eIOL.   ROM: 1h 21m with clear fluid GBS Status: Negative/-- (05/09 0946) Maximum Maternal Temperature: 48F  Labor Progress: Initial SVE: 2/80/-1. She then progressed to complete with assistance of one cytotec and AROM at 9.5cm.   Delivery Date/Time: 6/1 at 1504  Delivery: Called to room and patient was complete and pushing. Head delivered R OA. No nuchal cord present. Shoulder and body delivered in usual fashion. Infant with spontaneous cry, placed on mother's abdomen, dried and stimulated. Cord clamped x 2 after 1-minute delay, and cut by FOB. Cord blood drawn. Placenta delivered spontaneously with gentle cord traction. Fundus firm with massage, lower uterine sweep, and Pitocin. Labia, perineum, vagina, and cervix inspected with subsequent repair performed by Dr. Si Raider. There was some brisk bleeding immediately after removal placenta. Manual sweep by Dr. Si Raider revealed moderate clot in lower uterine segment and minimal at fundus, removed, and bleeding resolved.  Baby Weight: pending  Placenta: 3 vessel, intact. Sent to L&D Complications: None Lacerations: bilateral periurethral and mild second degree perineal. Right periurethral was bleeding so repaired with 3-0 vicryl which was also used to repair perineal laceration.  EBL: 200 mL Analgesia: Epidural   Infant:  APGAR (1 MIN): 9   APGAR (5 MINS): 9    Darrelyn Hillock, DO  OB Family Medicine Fellow, [facility] for [facility] 03/26/2022, 4:26 PM   Attestation of Attending Supervision of Provider:  Evaluation and management procedures were performed by this provider under my supervision and collaboration. I have reviewed the provider's note and chart, and I agree with the management and plan. I performed the repair and manual uterine sweep.  Laurey Arrow, MD Faculty  Practice, [facility]

## 2021-10-29 ENCOUNTER — Other Ambulatory Visit: Payer: Self-pay

## 2021-10-29 ENCOUNTER — Ambulatory Visit: Payer: Medicaid Other | Attending: Obstetrics

## 2021-10-29 DIAGNOSIS — Z363 Encounter for antenatal screening for malformations: Secondary | ICD-10-CM | POA: Insufficient documentation

## 2021-10-29 DIAGNOSIS — Z3402 Encounter for supervision of normal first pregnancy, second trimester: Secondary | ICD-10-CM

## 2021-11-12 ENCOUNTER — Other Ambulatory Visit: Payer: Self-pay

## 2021-11-12 ENCOUNTER — Encounter: Payer: Self-pay | Admitting: Obstetrics and Gynecology

## 2021-11-12 ENCOUNTER — Ambulatory Visit (INDEPENDENT_AMBULATORY_CARE_PROVIDER_SITE_OTHER): Payer: Medicaid Other | Admitting: Obstetrics and Gynecology

## 2021-11-12 VITALS — BP 107/67 | HR 67 | Wt 128.3 lb

## 2021-11-12 DIAGNOSIS — Z3402 Encounter for supervision of normal first pregnancy, second trimester: Secondary | ICD-10-CM

## 2021-11-12 NOTE — Progress Notes (Signed)
° °  PRENATAL VISIT NOTE  Subjective:  Vanessa Noble is a 21 y.o. G1P0 at [redacted]w[redacted]d being seen today for ongoing prenatal care.  She is currently monitored for the following issues for this low-risk pregnancy and has Dysmenorrhea in adolescent; Acne vulgaris; Dyshydrosis; Supervision of normal first pregnancy; and LGSIL on Pap smear of cervix on their problem list.  Patient reports no complaints.  Contractions: Not present. Vag. Bleeding: None.  Movement: Present. Denies leaking of fluid.   The following portions of the patient's history were reviewed and updated as appropriate: allergies, current medications, past family history, past medical history, past social history, past surgical history and problem list.   Objective:   Vitals:   11/12/21 0931  BP: 107/67  Pulse: 67  Weight: 128 lb 4.8 oz (58.2 kg)    Fetal Status: Fetal Heart Rate (bpm): 155   Movement: Present     General:  Alert, oriented and cooperative. Patient is in no acute distress.  Skin: Skin is warm and dry. No rash noted.   Cardiovascular: Normal heart rate noted  Respiratory: Normal respiratory effort, no problems with respiration noted  Abdomen: Soft, gravid, appropriate for gestational age.  Pain/Pressure: Absent     Pelvic: Cervical exam deferred        Extremities: Normal range of motion.  Edema: None  Mental Status: Normal mood and affect. Normal behavior. Normal judgment and thought content.   Assessment and Plan:  Pregnancy: G1P0 at [redacted]w[redacted]d 1. Encounter for supervision of normal first pregnancy in second trimester Anatomy complete, AFP wnl Horizon and NIPS wnl S/p flu shot  Preterm labor symptoms and general obstetric precautions including but not limited to vaginal bleeding, contractions, leaking of fluid and fetal movement were reviewed in detail with the patient. Please refer to After Visit Summary for other counseling recommendations.   Return in about 4 weeks (around 12/10/2021) for OB VISIT, MD  or APP.  Future Appointments  Date Time Provider [place]  12/10/2021  8:55 AM Luvenia Redden, PA-C [facility] None     Radene Gunning, MD

## 2021-11-12 NOTE — Progress Notes (Signed)
Patient presents for ROB. Patient has no concerns today. 

## 2021-12-10 ENCOUNTER — Ambulatory Visit (INDEPENDENT_AMBULATORY_CARE_PROVIDER_SITE_OTHER): Payer: Medicaid Other | Admitting: Medical

## 2021-12-10 ENCOUNTER — Other Ambulatory Visit: Payer: Self-pay

## 2021-12-10 ENCOUNTER — Encounter: Payer: Self-pay | Admitting: Medical

## 2021-12-10 VITALS — BP 103/66 | HR 71 | Wt 137.0 lb

## 2021-12-10 DIAGNOSIS — Z3402 Encounter for supervision of normal first pregnancy, second trimester: Secondary | ICD-10-CM

## 2021-12-10 DIAGNOSIS — R87612 Low grade squamous intraepithelial lesion on cytologic smear of cervix (LGSIL): Secondary | ICD-10-CM

## 2021-12-10 DIAGNOSIS — Z3A25 25 weeks gestation of pregnancy: Secondary | ICD-10-CM

## 2021-12-10 NOTE — Progress Notes (Signed)
ROB reports no concerns today. 

## 2021-12-10 NOTE — Patient Instructions (Signed)
AREA PEDIATRIC/FAMILY PRACTICE PHYSICIANS  [place]outheast [place] ([address]) [facility] Chambliss, MD; Eniola, MD; Hale, MD; Hensel, MD; McDiarmid, MD; McIntyer, MD; Neal, MD; Walden, MD [address] [place], [place] [address] [phone] Mon-Fri 8:30-12:30, 1:30-5:00 Providers come to see babies at [facility] Accepting Medicaid [facility] providers who accept newborns: Koirala, MD; Morrow, MD; Wolters, MD [address], [place], [place] [address] [phone] Mon-Fri 8:00-5:30 Babies seen by providers at [facility] Does NOT accept Medicaid Please call early in hospitalization for appointment (limited availability)  [facility] Mulberry, MD [address] [place], [place] [address] [phone] Mon, Tue, Thur, Fri 8:30-5:00, Wed 10:00-7:00 (closed 1-2pm) Babies seen by [facility] providers Accepting Medicaid Rubin - Pediatrician Rubin, MD [address], [place], [place] [address] [phone] Mon-Fri 8:30-5:00, Sat 8:30-12:00 Provider comes to see babies at [facility] Accepting Medicaid Must have been referred from current patients or contacted office prior to delivery [facility] ([facility]) Brown, MD; Chandler, MD; Ettefagh, MD; Grant, MD; Lester, MD; McCormick, MD; McQueen, MD; Prose, MD; Simha, MD; Stanley, MD; Stryffeler, NP; Tebben, NP [address], [place], [place] [address] [phone] Mon, Tue, Thur, Fri 8:30-5:30, Wed 9:30-5:30, Sat 8:30-12:30 Babies seen by [facility] providers Accepting Medicaid Only accepting infants of first-time parents or siblings of current patients Hospital discharge coordinator will make follow-up appointment Jack Amos [address], [place], [place]  [address] [phone]   Fax - [phone] [facility] [address], [place], [place]  [address] Phone - [phone]   Fax - [phone] Shilpa Gosrani [address], [place], [place]  [address] [phone]  [place] ([address]) [facility] Bates, MD; [place]assfield, MD; Cooper, Cox, MD; MD; Davis, MD; Dovico, MD; Ettefaugh, MD; Little, MD; Lowe, MD; Keiffer, MD; Melvin, MD; Sumner, MD; Williams, MD [address], [place], [place] [address] [phone] Mon-Fri 8:30-5:00 (extended evenings Mon-Thur as needed), Sat-Sun 10:00-1:00 Providers come to see babies at [facility] Accepting Medicaid for families of first-time babies and families with all children in the household age 3 and under. Must register with office prior to making appointment (M-F only). [facility] Henson, NP; Knapp, MD; Lalonde, MD; Tysinger, PA [address] [place], [place] [address] [phone] Mon-Fri 8:00-5:00 Babies seen by providers at [facility] Does NOT accept Medicaid/Commercial Insurance Only [facility] ([facility])  Artis, MD; Barnes, MD; Bratton, MD; Coccaro, MD; Lockett Gardner, MD; Kramer, MD; Marshall, MD; Netherton, MD; Poleto, MD; Skinner, MD [address] [place], [place] [address] [phone] Mon-Fri 8:30-5:30, Sat (Oct.-Mar.) 9:00-1:00 Babies seen by providers at [facility] Accepting Medicaid  [place] ([address]) [facility] Reid, MD; Warner, MD [address], [place], [place] [address] [phone] Mon-Fri 8:30-5:00, Sat 8:30-12:00 Providers come to see babies at [facility] Does NOT accept Medicaid [facility] Becker, PA; Hagler, MD; Scifres, PA; Sun, MD; Swayne, MD [address], [place], [place] [address] [phone] Mon-Fri 8:00-5:00 Babies seen by providers at [facility] Does NOT accept Medicaid Only accepting babies of parents who  are patients Please call early in hospitalization for appointment (limited availability)  Pediatricians Clark, MD; Frye, MD; Kelleher, MD; Mack, NP; Miller, MD; O'Keller, MD; Patterson, NP; Pudlo, MD; Puzio, MD; Thomas, MD; Tucker, MD; Twiselton, MD [address], [place], [place] [address] [phone] Mon-Fri 8:00-5:00, Sat 9:00-12:00 Providers come to see babies at [facility] Does NOT accept Medicaid  [place] ([address]) [facility] providers accepting new patients: Brake, NP; Wharton, PA [address], [place], [place] [address] [phone] Mon-Fri 8:00-5:00 Babies seen by providers at [facility] Does NOT accept Medicaid Only accepting babies of parents who are patients Please call early in hospitalization for appointment (limited availability) [facility] Gay, MD; Quinlan, MD [address] [place], [place] [address] [phone] (press [phone] to schedule appointment) Mon-Fri 8:00-5:00 Providers come to see babies at [facility] Does NOT accept Medicaid [facility] Mazer, MD [address] [place], [place] [address] [phone] Mon-Fri 8:30-5:00 (lunch 12:30-1:00), extended hours by appointment only Wed 5:00-6:30 Babies seen by [facility] providers Accepting Medicaid [facility] Banks, MD; Jordan, MD; Koberlein, MD [address], [place], [place] [address] [phone] Mon-Fri 8:00-5:00 Babies seen by [facility] providers Does NOT accept Medicaid [facility] Parker, MD; Hunter, MD; Wallace, DO [address] [place], [place] [address] [phone] Mon-Fri 8:00-5:00 Babies seen by [facility] providers Does NOT accept Medicaid [facility] Brandon, PA; Brecken, PA; Christy, NP; Dees, MD; DeClaire, MD; DeWeese, MD; Hansen, NP; Mills, NP; Parrish, NP; Smoot, NP; Summer, MD; Vapne,  MD [address] [place], [place] [address] [phone] Mon-Fri 8:30-5:00, Sat 10:00-1:00 Providers come to see babies at [facility] Does NOT accept Medicaid Free prenatal information session Tuesdays at 4:45pm [facility] Bouska, MD; Gordon, PA; Jeffery, PA; Weber, PA [address] [place] [address] [phone] Mon-Fri 7:30-5:30 Babies seen by [facility] providers [facility] [address], [place], [place]  [address] [phone]   Fax - [phone]  [place] ([address] & [address]) [facility] [place] [address] [place], [place] [address] [phone] Mon-Thur 8:00-6:00 Providers come to see babies at [facility] Accepting Medicaid [facility] Anderson, NP; Badger, MD; Beal, PA; Spencer, PA [address] [place], [place] [address] [phone] Mon-Thur 7:30-7:30, Fri 7:30-4:30 Babies seen by [facility] providers Accepting Medicaid [facility] Agbuya, MD; Klett, NP; Romgoolam, MD [address], [place], [place] [address] [phone] Mon-Fri 8:30-5:00, Sat 8:30-12:00 Providers come to see babies at [facility] Accepting Medicaid Must have "Meet & Greet" appointment at office prior to delivery [facility] McCord, MD; Wallace, MD; Wood, MD [address], [place], [place] [address] [phone] Mon-Wed 8:00-6:00, Thur-Fri 8:00-5:00, Sat 9:00-12:00 Providers come to see babies at [facility] Does NOT accept Medicaid Only accepting siblings of current patients [facility]  [address], [place], [place]  [address] [phone]   Fax - [phone] [facility] [address], [place], [place]  [address] [phone]   Fax -  [phone]  [place] ([address] & [address]) [facility] Cirigliano, DO; Matthews, DO [address] [place], [place] [address] [phone] Mon-Fri 7:00-5:00 Babies seen by [facility] providers Does NOT accept Medicaid [facility] Briscoe, MD; Howley, PA; Moreira, PA [address], [place],  [address] [phone] Mon-Fri 8:00-5:00 Babies seen by [facility] providers Accepting Medicaid [facility] Boyd, MD; Church, PA; Jones, NP; Osborn, PA [address], [place],  [address] [phone] Mon-Fri 8:00-5:00 Babies seen by providers at [facility] Accepting Medicaid  [place] ([address]) [facility] [place] Wendling, DO [address] [place], [place] [address] [phone] Mon-Fri 8:00-5:00 Babies seen by [facility] providers Does NOT accept Medicaid Limited availability, please call early in hospitalization to schedule follow-up [facility] Calderon, PA; Cummings, MD; Dillard, MD; Martin, PA; Olson, MD; VanDeven, PA [address], [place], [place] [address] [phone] Mon-Fri 8:30-5:00, Sat 9:00-12:00 Babies seen by providers at [facility] Accepting Medicaid Please register online then schedule online or call office [email or web address] Hunter, NP; Kumar, MD; Martin Rogers, PA [address], [place], [place] [address] [phone] Mon-Fri 8:00-5:00 Babies seen by providers at [facility] Accepting Medicaid [facility] ([facility]) Clearmont, MD; Kristi Fleenor, NP; [place], MD [address], [place], [place] [address] [phone] Mon-Fri 8:00-5:30, Sat&Sun by appointment (phones open at  8:30) Babies seen by [facility] providers Accepting Medicaid Must be a first-time baby or sibling of current patient [facility] [place]  [address], [place], [place]  [address] [phone]   Fax - [phone]  [place] ([address]) [facility] Brown, PA; Cowen, PA; Rice, MD; Helton, PA; Spry, MD [address] [place], [place] [address] [phone] Mon-Thur 8:00-7:00, Fri 8:00-5:00, Sat 8:00-12:00, Sun 9:00-12:00 Babies seen by [facility] providers Accepting Medicaid [facility] Coe-Goins, MD; Marshall, MD; Pierre-Louis, MD [address], [place], [place] [address] [phone] Mon-Thur 8:00-5:00 Babies seen by providers at [facility] Accepting Medicaid [facility] Bratton, MD; Coe-Goins, MD; Hayes, MD; Lewis, MD; List, MD; Lott, MD; Marshall, MD; [place], MD; O'Neal, MD; Pierre-Louis, MD; Pitonzo, MD; Scholer, MD; Spangle, MD [address] [place], [place] [address] [phone] Mon-Fri 8:00-5:30, Sat (Oct.-Mar.) 9:00-1:00 Babies seen by providers at [facility] Accepting Medicaid Must fill out new patient packet, available online at [email or web address] [facility] ([facility]) Friddle, NP; Harris, NP; Kelly, NP; Logan, MD; Melvin, PA; Poth, MD; Ramadoss, MD; [place], NP [address], [place], [place] [address] [phone] Mon-Thur 8:00-5:30, Fri 8:00-5:00 Babies seen by providers at [facility] Accepting Medicaid  [place] ([address]) [facility] Dixon, PA; Penuelas, MD; Pickard, MD; Tapia, PA [address], [place], [place] [address] [phone] Mon-Fri 8:00-5:00 Babies seen by providers at [facility] Accepting Medicaid   [place] ([address]) [facility] [place] Masneri, DO; Meyers, MD; Nelson, PA [address], [place], [place] [address] [phone] Mon-Fri 8:00-5:00 Babies seen by providers at [facility] Does NOT accept Medicaid Limited appointment availability, please call early in hospitalization  [facility] [place] Kunedd, DO; McGowen, MD [address], [place], [place] [address] [phone] Mon-Fri 8:00-5:00 Babies seen by [facility] providers Does NOT accept Medicaid [facility] [place] Cameron, MD; MacDonald, MD; Michaels, PA; Nayak, MD [address] [place] [address], [place], [place] [address] [phone] Mon-Fri 8:00-5:00 After hours clinic ([address] [place],  [address]) [phone] Mon-Fri 5:00-8:00, Sat 12:00-6:00, Sun 10:00-4:00 Babies seen by [facility] providers Accepting Medicaid [facility] [place] [address], [place], [place]  [address] [phone]   Fax - [phone]  [place] ([address])  [facility] [place] [facility] Andy, MD [address], [place], [place] [address] [phone] Mon-Fri 8:00-5:00 Babies seen by [facility] providers Does NOT accept Medicaid [facility] [place] ([facility] [place]) Eksir, MD [address], [place], [place] [address] [phone] Mon-Thur 8:00-7:00, Fri 8:00-5:00, Sat 8:00-12:00 Babies seen by providers at [facility] Accepting Medicaid - but does not have vaccinations in office (must be received elsewhere) Limited availability, please call early in hospitalization  [place] ([address]) [facility]  Charlene Flemming, MD [address],  [place] [address] [phone]  Fax [phone]  [facility]  Kimberly Newton, MD, Annamarie Streilein, PA, Carla Hampton, PA [address] [place] [place], [place]  [address] [phone] [facility]  [address], [place], [place] [address] [phone] [address], [place], [place] [address] [phone] (West Office)  [facility] [address], [place], [place] [address] [phone] [facility] [address] [place], [place], [place] [address] [phone] [facility] [address], [place], [place] [address] [phone] [facility] [address], [place], [place] [address] [phone] [facility] [address], [place], [place] [address] [phone] [facility] [address], [place], [place] [address] [phone] [facility]  [address], [place], [place] [address] [phone] [facility] [address], [place], [place] [address] [phone] [facility] [address], [place], [place] [address] [phone] [facility] [address], [place],  [address] [phone]  

## 2021-12-10 NOTE — Progress Notes (Signed)
° °  PRENATAL VISIT NOTE  Subjective:  Vanessa Noble is a 21 y.o. G1P0 at [redacted]w[redacted]d being seen today for ongoing prenatal care.  She is currently monitored for the following issues for this low-risk pregnancy and has Dysmenorrhea in adolescent; Acne vulgaris; Dyshydrosis; Supervision of normal first pregnancy; and LGSIL on Pap smear of cervix on their problem list.  Patient reports no complaints.  Contractions: Not present. Vag. Bleeding: None.  Movement: Present. Denies leaking of fluid.   The following portions of the patient's history were reviewed and updated as appropriate: allergies, current medications, past family history, past medical history, past social history, past surgical history and problem list.   Objective:   Vitals:   12/10/21 0902  BP: 103/66  Pulse: 71  Weight: 137 lb (62.1 kg)    Fetal Status: Fetal Heart Rate (bpm): 150 Fundal Height: 25 cm Movement: Present     General:  Alert, oriented and cooperative. Patient is in no acute distress.  Skin: Skin is warm and dry. No rash noted.   Cardiovascular: Normal heart rate noted  Respiratory: Normal respiratory effort, no problems with respiration noted  Abdomen: Soft, gravid, appropriate for gestational age.  Pain/Pressure: Absent     Pelvic: Cervical exam deferred        Extremities: Normal range of motion.  Edema: None  Mental Status: Normal mood and affect. Normal behavior. Normal judgment and thought content.   Assessment and Plan:  Pregnancy: G1P0 at [redacted]w[redacted]d 1. Encounter for supervision of normal first pregnancy in second trimester - Peds list given and importance of choosing peds discussed  - Still unsure of MOC, no additional questions today  - Normal anatomy US results reviewed  - Anticipatory guidance for next visit including fasting GTT, CBC, HIV, RPR and TDAP discussed   2. LGSIL on Pap smear of cervix - repeat in 1 year  3. [redacted] weeks gestation of pregnancy  Preterm labor symptoms and general  obstetric precautions including but not limited to vaginal bleeding, contractions, leaking of fluid and fetal movement were reviewed in detail with the patient. Please refer to After Visit Summary for other counseling recommendations.   Return in about 3 weeks (around 12/31/2021) for LOB, 28 week labs (fasting), In-Person, any provider.  No future appointments.  Vonzella Nipple, PA-C

## 2021-12-31 ENCOUNTER — Other Ambulatory Visit: Payer: Medicaid Other

## 2021-12-31 ENCOUNTER — Encounter: Payer: Medicaid Other | Admitting: Medical

## 2022-01-02 ENCOUNTER — Other Ambulatory Visit: Payer: Medicaid Other

## 2022-01-02 ENCOUNTER — Other Ambulatory Visit: Payer: Self-pay

## 2022-01-02 ENCOUNTER — Ambulatory Visit (INDEPENDENT_AMBULATORY_CARE_PROVIDER_SITE_OTHER): Payer: Medicaid Other | Admitting: Certified Nurse Midwife

## 2022-01-02 VITALS — Wt 139.2 lb

## 2022-01-02 DIAGNOSIS — Z3493 Encounter for supervision of normal pregnancy, unspecified, third trimester: Secondary | ICD-10-CM | POA: Diagnosis not present

## 2022-01-02 DIAGNOSIS — Z23 Encounter for immunization: Secondary | ICD-10-CM | POA: Diagnosis not present

## 2022-01-02 DIAGNOSIS — Z3A28 28 weeks gestation of pregnancy: Secondary | ICD-10-CM | POA: Diagnosis not present

## 2022-01-02 NOTE — Progress Notes (Signed)
Patient presents for ROB @ 28.2 weeks. 28 week labs collected today. TDAP given, tolerated well.  ?No concerns or complaints today.  ?

## 2022-01-02 NOTE — Progress Notes (Signed)
? ?  PRENATAL VISIT NOTE ? ?Subjective:  ?Vanessa Noble is a 21 y.o. G1P0 at [redacted]w[redacted]d being seen today for ongoing prenatal care.  She is currently monitored for the following issues for this low-risk pregnancy and has Dysmenorrhea in adolescent; Acne vulgaris; Dyshydrosis; Supervision of normal first pregnancy; and LGSIL on Pap smear of cervix on their problem list. ? ?Patient reports no complaints.  Contractions: Not present. Vag. Bleeding: None.  Movement: Present. Denies leaking of fluid.  ? ?The following portions of the patient's history were reviewed and updated as appropriate: allergies, current medications, past family history, past medical history, past social history, past surgical history and problem list.  ? ?Objective:  ? ?Vitals:  ? 01/02/22 0902  ?Weight: 139 lb 3.2 oz (63.1 kg)  ? ? ?Fetal Status: Fetal Heart Rate (bpm): 155   Movement: Present    ? ?General:  Alert, oriented and cooperative. Patient is in no acute distress.  ?Skin: Skin is warm and dry. No rash noted.   ?Cardiovascular: Normal heart rate noted  ?Respiratory: Normal respiratory effort, no problems with respiration noted  ?Abdomen: Soft, gravid, appropriate for gestational age.  Pain/Pressure: Absent     ?Pelvic: Cervical exam deferred        ?Extremities: Normal range of motion.  Edema: None  ?Mental Status: Normal mood and affect. Normal behavior. Normal judgment and thought content.  ? ?Assessment and Plan:  ?Pregnancy: G1P0 at [redacted]w[redacted]d ?1. Supervision of low-risk pregnancy, third trimester ?- Doing well, feeling regular and vigorous fetal movement  ?- Encouraged her to begin thinking about birth planning and discussed normalcy of intense emotions during pregnancy and gave FOB guidance on how to support pt well.  ? ?2. [redacted] weeks gestation of pregnancy ?- Routine OB care including: ?- CBC ?- HIV Antibody (routine testing w rflx) ?- RPR ?- Glucose Tolerance, 2 Hours w/1 Hour ? ?Preterm labor symptoms and general obstetric  precautions including but not limited to vaginal bleeding, contractions, leaking of fluid and fetal movement were reviewed in detail with the patient. ?Please refer to After Visit Summary for other counseling recommendations.  ? ?Return in about 2 weeks (around 01/16/2022) for IN-PERSON, LOB. ? ?Future Appointments  ?Date Time Provider Department Center  ?01/16/2022  8:55 AM Hermina Staggers, MD [facility] None  ? ?Bernerd Limbo, CNM ? ?

## 2022-01-03 LAB — CBC
Hematocrit: 33.8 % — ABNORMAL LOW (ref 34.0–46.6)
Hemoglobin: 11.3 g/dL (ref 11.1–15.9)
MCH: 29.8 pg (ref 26.6–33.0)
MCHC: 33.4 g/dL (ref 31.5–35.7)
MCV: 89 fL (ref 79–97)
Platelets: 283 10*3/uL (ref 150–450)
RBC: 3.79 x10E6/uL (ref 3.77–5.28)
RDW: 12.2 % (ref 11.7–15.4)
WBC: 12 10*3/uL — ABNORMAL HIGH (ref 3.4–10.8)

## 2022-01-03 LAB — GLUCOSE TOLERANCE, 2 HOURS W/ 1HR
Glucose, 1 hour: 138 mg/dL (ref 70–179)
Glucose, 2 hour: 112 mg/dL (ref 70–152)
Glucose, Fasting: 89 mg/dL (ref 70–91)

## 2022-01-03 LAB — HIV ANTIBODY (ROUTINE TESTING W REFLEX): HIV Screen 4th Generation wRfx: NONREACTIVE

## 2022-01-03 LAB — RPR: RPR Ser Ql: NONREACTIVE

## 2022-01-16 ENCOUNTER — Encounter: Payer: Self-pay | Admitting: Obstetrics and Gynecology

## 2022-01-16 ENCOUNTER — Ambulatory Visit (INDEPENDENT_AMBULATORY_CARE_PROVIDER_SITE_OTHER): Payer: Medicaid Other | Admitting: Obstetrics and Gynecology

## 2022-01-16 ENCOUNTER — Other Ambulatory Visit: Payer: Self-pay

## 2022-01-16 VITALS — BP 106/72 | HR 88 | Wt 147.1 lb

## 2022-01-16 DIAGNOSIS — Z3403 Encounter for supervision of normal first pregnancy, third trimester: Secondary | ICD-10-CM

## 2022-01-16 DIAGNOSIS — Z3493 Encounter for supervision of normal pregnancy, unspecified, third trimester: Secondary | ICD-10-CM

## 2022-01-16 DIAGNOSIS — R87612 Low grade squamous intraepithelial lesion on cytologic smear of cervix (LGSIL): Secondary | ICD-10-CM

## 2022-01-16 MED ORDER — VITAFOL-NANO 18-0.6-0.4 MG PO TABS: 1.0000 | ORAL_TABLET | Freq: Every day | ORAL | 12 refills | Status: AC

## 2022-01-16 NOTE — Progress Notes (Signed)
Subjective:  ?Vanessa Noble is a 21 y.o. G1P0 at [redacted]w[redacted]d being seen today for ongoing prenatal care.  She is currently monitored for the following issues for this low-risk pregnancy and has Dysmenorrhea in adolescent; Acne vulgaris; Dyshydrosis; Supervision of normal first pregnancy; and LGSIL on Pap smear of cervix on their problem list. ? ?Patient reports no complaints.  Contractions: Not present. Vag. Bleeding: None.  Movement: Present. Denies leaking of fluid.  ? ?The following portions of the patient's history were reviewed and updated as appropriate: allergies, current medications, past family history, past medical history, past social history, past surgical history and problem list. Problem list updated. ? ?Objective:  ? ?Vitals:  ? 01/16/22 0848  ?BP: 106/72  ?Pulse: 88  ?Weight: 147 lb 1.6 oz (66.7 kg)  ? ? ?Fetal Status: Fetal Heart Rate (bpm): 139   Movement: Present    ? ?General:  Alert, oriented and cooperative. Patient is in no acute distress.  ?Skin: Skin is warm and dry. No rash noted.   ?Cardiovascular: Normal heart rate noted  ?Respiratory: Normal respiratory effort, no problems with respiration noted  ?Abdomen: Soft, gravid, appropriate for gestational age. Pain/Pressure: Absent     ?Pelvic:  Cervical exam deferred        ?Extremities: Normal range of motion.  Edema: None  ?Mental Status: Normal mood and affect. Normal behavior. Normal judgment and thought content.  ? ?Urinalysis:     ? ?Assessment and Plan:  ?Pregnancy: G1P0 at [redacted]w[redacted]d ? ?1. Supervision of low-risk pregnancy, third trimester ?Stable ?- Prenatal-Fe Fum-Methf-FA w/o A (VITAFOL-NANO) 18-0.6-0.4 MG TABS; Take 1 tablet by mouth daily.  Dispense: 30 tablet; Refill: 12 ? ?2. Encounter for supervision of normal first pregnancy in third trimester ?Stable ? ?3. LGSIL on Pap smear of cervix ?Repeat in 1 yr ? ?Preterm labor symptoms and general obstetric precautions including but not limited to vaginal bleeding, contractions, leaking  of fluid and fetal movement were reviewed in detail with the patient. ?Please refer to After Visit Summary for other counseling recommendations.  ?Return in about 2 weeks (around 01/30/2022) for OB visit, face to face, any provider. ? ? ?Chancy Milroy, MD ?

## 2022-01-16 NOTE — Patient Instructions (Signed)

## 2022-01-16 NOTE — Progress Notes (Signed)
ROB 30.[redacted] wks GA ?No concerns ?Was taking OTC PNV, request RX, RX sent ? ?

## 2022-02-02 ENCOUNTER — Encounter: Payer: Self-pay | Admitting: Obstetrics

## 2022-02-02 ENCOUNTER — Ambulatory Visit (INDEPENDENT_AMBULATORY_CARE_PROVIDER_SITE_OTHER): Payer: Medicaid Other | Admitting: Obstetrics

## 2022-02-02 DIAGNOSIS — Z3403 Encounter for supervision of normal first pregnancy, third trimester: Secondary | ICD-10-CM

## 2022-02-02 NOTE — Progress Notes (Signed)
Subjective:  ?Vanessa Noble is a 21 y.o. G1P0 at 107w5d being seen today for ongoing prenatal care.  She is currently monitored for the following issues for this low-risk pregnancy and has Dysmenorrhea in adolescent; Acne vulgaris; Dyshydrosis; Supervision of normal first pregnancy; and LGSIL on Pap smear of cervix on their problem list. ? ?Patient reports backache and heartburn.  Contractions: Not present. Vag. Bleeding: None.  Movement: Present. Denies leaking of fluid.  ? ?The following portions of the patient's history were reviewed and updated as appropriate: allergies, current medications, past family history, past medical history, past social history, past surgical history and problem list. Problem list updated. ? ?Objective:  ? ?Vitals:  ? 02/02/22 0903  ?BP: 110/67  ?Pulse: 60  ?Weight: 149 lb 3.2 oz (67.7 kg)  ? ? ?Fetal Status:     Movement: Present    ? ?General:  Alert, oriented and cooperative. Patient is in no acute distress.  ?Skin: Skin is warm and dry. No rash noted.   ?Cardiovascular: Normal heart rate noted  ?Respiratory: Normal respiratory effort, no problems with respiration noted  ?Abdomen: Soft, gravid, appropriate for gestational age. Pain/Pressure: Absent     ?Pelvic:  Cervical exam deferred        ?Extremities: Normal range of motion.  Edema: Trace  ?Mental Status: Normal mood and affect. Normal behavior. Normal judgment and thought content.  ? ?Urinalysis:     ? ?Assessment and Plan:  ?Pregnancy: G1P0 at [redacted]w[redacted]d ? ?1. Encounter for supervision of normal first pregnancy in third trimester ? ? ?Preterm labor symptoms and general obstetric precautions including but not limited to vaginal bleeding, contractions, leaking of fluid and fetal movement were reviewed in detail with the patient. ?Please refer to After Visit Summary for other counseling recommendations.  ? ?Return in about 2 weeks (around 02/16/2022). ? ? ?Shelly Bombard, MD  ?02/02/22  ?

## 2022-02-02 NOTE — Progress Notes (Signed)
Pt presents for ROB reports no concerns today.  ?

## 2022-02-17 ENCOUNTER — Encounter: Payer: Self-pay | Admitting: Obstetrics

## 2022-02-17 ENCOUNTER — Ambulatory Visit (INDEPENDENT_AMBULATORY_CARE_PROVIDER_SITE_OTHER): Payer: Medicaid Other | Admitting: Obstetrics

## 2022-02-17 DIAGNOSIS — Z3403 Encounter for supervision of normal first pregnancy, third trimester: Secondary | ICD-10-CM

## 2022-02-17 NOTE — Progress Notes (Signed)
Subjective:  ?Vanessa Noble is a 21 y.o. G1P0 at [redacted]w[redacted]d being seen today for ongoing prenatal care.  She is currently monitored for the following issues for this low-risk pregnancy and has Dysmenorrhea in adolescent; Acne vulgaris; Dyshydrosis; Supervision of normal first pregnancy; and LGSIL on Pap smear of cervix on their problem list. ? ?Patient reports backache.  Contractions: Not present. Vag. Bleeding: None.  Movement: Present. Denies leaking of fluid.  ? ?The following portions of the patient's history were reviewed and updated as appropriate: allergies, current medications, past family history, past medical history, past social history, past surgical history and problem list. Problem list updated. ? ?Objective:  ? ?Vitals:  ? 02/17/22 0839  ?BP: 112/73  ?Pulse: 80  ?Weight: 154 lb (69.9 kg)  ? ? ?Fetal Status:     Movement: Present    ? ?General:  Alert, oriented and cooperative. Patient is in no acute distress.  ?Skin: Skin is warm and dry. No rash noted.   ?Cardiovascular: Normal heart rate noted  ?Respiratory: Normal respiratory effort, no problems with respiration noted  ?Abdomen: Soft, gravid, appropriate for gestational age. Pain/Pressure: Absent     ?Pelvic:  Cervical exam deferred        ?Extremities: Normal range of motion.     ?Mental Status: Normal mood and affect. Normal behavior. Normal judgment and thought content.  ? ?Urinalysis:     ? ?Assessment and Plan:  ?Pregnancy: G1P0 at [redacted]w[redacted]d ? ?1. Encounter for supervision of normal first pregnancy in third trimester ? ?Preterm labor symptoms and general obstetric precautions including but not limited to vaginal bleeding, contractions, leaking of fluid and fetal movement were reviewed in detail with the patient. ?Please refer to After Visit Summary for other counseling recommendations.  ? ?Return in about 2 weeks (around 03/03/2022) for ROB. ? ? ?Brock Bad, MD  ?02/17/22  ?

## 2022-02-25 ENCOUNTER — Ambulatory Visit (INDEPENDENT_AMBULATORY_CARE_PROVIDER_SITE_OTHER): Payer: Self-pay | Admitting: Pediatrics

## 2022-02-25 DIAGNOSIS — Z7681 Expectant parent(s) prebirth pediatrician visit: Secondary | ICD-10-CM

## 2022-02-25 NOTE — Progress Notes (Signed)
Prenatal counseling for impending newborn done--  Reviewed current vaccine policy and answered all questions.  1st child, Currently 36 weeks, Current complications:  none, Prenatal care initiated:  early ?Z76.81 ? ? ?

## 2022-03-03 ENCOUNTER — Other Ambulatory Visit (HOSPITAL_COMMUNITY)
Admission: RE | Admit: 2022-03-03 | Discharge: 2022-03-03 | Disposition: A | Discharge status: Home / Self Care | Payer: Medicaid Other | Source: Ambulatory Visit | Attending: Obstetrics and Gynecology | Admitting: Obstetrics and Gynecology

## 2022-03-03 ENCOUNTER — Ambulatory Visit (INDEPENDENT_AMBULATORY_CARE_PROVIDER_SITE_OTHER): Payer: Medicaid Other | Admitting: Obstetrics and Gynecology

## 2022-03-03 ENCOUNTER — Encounter: Payer: Self-pay | Admitting: Obstetrics and Gynecology

## 2022-03-03 VITALS — BP 113/73 | HR 73 | Wt 157.6 lb

## 2022-03-03 DIAGNOSIS — Z3403 Encounter for supervision of normal first pregnancy, third trimester: Secondary | ICD-10-CM | POA: Diagnosis present

## 2022-03-03 DIAGNOSIS — Z3A36 36 weeks gestation of pregnancy: Secondary | ICD-10-CM

## 2022-03-03 NOTE — Progress Notes (Signed)
ROB pt in office with no concerns at this time.  ?

## 2022-03-03 NOTE — Progress Notes (Signed)
? ?  PRENATAL VISIT NOTE ? ?Subjective:  ?Vanessa Noble is a 21 y.o. G1P0 at [redacted]w[redacted]d being seen today for ongoing prenatal care.  She is currently monitored for the following issues for this low-risk pregnancy and has Dysmenorrhea in adolescent; Acne vulgaris; Dyshydrosis; Supervision of normal first pregnancy; and LGSIL on Pap smear of cervix on their problem list. ? ?Patient reports no complaints.  Contractions: Not present. Vag. Bleeding: None.  Movement: Present. Denies leaking of fluid.  ? ?The following portions of the patient's history were reviewed and updated as appropriate: allergies, current medications, past family history, past medical history, past social history, past surgical history and problem list.  ? ?Objective:  ? ?Vitals:  ? 03/03/22 0904  ?BP: 113/73  ?Pulse: 73  ?Weight: 157 lb 9.6 oz (71.5 kg)  ? ? ?Fetal Status:   Fundal Height: 36 cm Movement: Present    ? ?General:  Alert, oriented and cooperative. Patient is in no acute distress.  ?Skin: Skin is warm and dry. No rash noted.   ?Cardiovascular: Normal heart rate noted  ?Respiratory: Normal respiratory effort, no problems with respiration noted  ?Abdomen: Soft, gravid, appropriate for gestational age.  Pain/Pressure: Absent     ?Pelvic: Cervical exam deferred        ?Extremities: Normal range of motion.  Edema: Trace  ?Mental Status: Normal mood and affect. Normal behavior. Normal judgment and thought content.  ? ?Assessment and Plan:  ?Pregnancy: G1P0 at [redacted]w[redacted]d ?1. Encounter for supervision of normal first pregnancy in third trimester ? ?- Culture, beta strep (group b only) ?- Cervicovaginal ancillary only( [facility]) ? ?Preterm labor symptoms and general obstetric precautions including but not limited to vaginal bleeding, contractions, leaking of fluid and fetal movement were reviewed in detail with the patient. ?Please refer to After Visit Summary for other counseling recommendations.  ? ?No follow-ups on file. ? ?No future  appointments. ? ?Venia Carbon, NP  ?

## 2022-03-04 LAB — CERVICOVAGINAL ANCILLARY ONLY
Chlamydia: NEGATIVE
Comment: NEGATIVE
Comment: NORMAL
Neisseria Gonorrhea: NEGATIVE

## 2022-03-07 LAB — CULTURE, BETA STREP (GROUP B ONLY): Strep Gp B Culture: NEGATIVE

## 2022-03-10 ENCOUNTER — Ambulatory Visit (INDEPENDENT_AMBULATORY_CARE_PROVIDER_SITE_OTHER): Payer: Medicaid Other | Admitting: Family Medicine

## 2022-03-10 VITALS — BP 118/76 | HR 79 | Wt 158.6 lb

## 2022-03-10 DIAGNOSIS — Z3009 Encounter for other general counseling and advice on contraception: Secondary | ICD-10-CM

## 2022-03-10 DIAGNOSIS — Z3403 Encounter for supervision of normal first pregnancy, third trimester: Secondary | ICD-10-CM

## 2022-03-10 DIAGNOSIS — Z3A37 37 weeks gestation of pregnancy: Secondary | ICD-10-CM

## 2022-03-10 NOTE — Progress Notes (Signed)
Pt presents for ROB No complaints today. 

## 2022-03-10 NOTE — Progress Notes (Signed)
?  Subjective:  ?Vanessa Noble is a 21 y.o. G1P0 at [redacted]w[redacted]d being seen today for ongoing prenatal care.  She is currently monitored for the following issues for this low-risk pregnancy and has Dysmenorrhea in adolescent; Acne vulgaris; Dyshydrosis; Supervision of normal first pregnancy; and LGSIL on Pap smear of cervix on their problem list. ? ?Patient reports  feet swelling .  Contractions: Not present. Vag. Bleeding: None.  Movement: Present. Denies leaking of fluid.  ? ?The following portions of the patient's history were reviewed and updated as appropriate: allergies, current medications, past family history, past medical history, past social history, past surgical history and problem list. Problem list updated. ? ?Objective:  ? ?Vitals:  ? 03/10/22 1342  ?BP: 118/76  ?Pulse: 79  ?Weight: 158 lb 9.6 oz (71.9 kg)  ? ? ?Fetal Status: Fetal Heart Rate (bpm): 144   Movement: Present    ? ?General:  Alert, oriented and cooperative. Patient is in no acute distress.  ?Skin: Skin is warm and dry. No rash noted.   ?Cardiovascular: Normal heart rate noted  ?Respiratory: Normal respiratory effort, no problems with respiration noted  ?Abdomen: Soft, gravid, appropriate for gestational age. Pain/Pressure: Absent     ?Pelvic: Vag. Bleeding: None     ?Cervical exam performed      1/thick/high  ?Extremities: Normal range of motion.  Edema: Trace  ?Mental Status: Normal mood and affect. Normal behavior. Normal judgment and thought content.  ? ? ?Assessment and Plan:  ?Pregnancy: G1P0 at [redacted]w[redacted]d ? ?1. Encounter for supervision of normal first pregnancy in third trimester ?Doing well. Normal heart tones. No concerns today. Would like cervix checked. Cervix ~1 cm/thick/high. Thick and unable to get to internal os as longer than full finger. Confirmed vertex by BSUS. ?- f/up in 1 week ? ?2. [redacted] weeks gestation of pregnancy ? ?3. Encounter for counseling regarding contraception ?Considering options. Thinking pills. Considering  OCPs.  ? ?Term labor symptoms and general obstetric precautions including but not limited to vaginal bleeding, contractions, leaking of fluid and fetal movement were reviewed in detail with the patient. ?Please refer to After Visit Summary for other counseling recommendations.  ?Return in about 1 week (around 03/17/2022). ? ?Warner Mccreedy, MD, MPH ?OB Fellow, [facility] ? ?

## 2022-03-12 ENCOUNTER — Encounter: Payer: Self-pay | Admitting: Obstetrics

## 2022-03-17 ENCOUNTER — Ambulatory Visit (INDEPENDENT_AMBULATORY_CARE_PROVIDER_SITE_OTHER): Payer: Medicaid Other | Admitting: Advanced Practice Midwife

## 2022-03-17 ENCOUNTER — Encounter: Payer: Self-pay | Admitting: Advanced Practice Midwife

## 2022-03-17 VITALS — BP 124/73 | HR 74 | Wt 161.0 lb

## 2022-03-17 DIAGNOSIS — Z3A38 38 weeks gestation of pregnancy: Secondary | ICD-10-CM

## 2022-03-17 DIAGNOSIS — Z3403 Encounter for supervision of normal first pregnancy, third trimester: Secondary | ICD-10-CM

## 2022-03-17 NOTE — Progress Notes (Signed)
Patient presents for ROB. Patient has no concerns today. Desires a cervix check today.

## 2022-03-17 NOTE — Progress Notes (Signed)
   PRENATAL VISIT NOTE  Subjective:  Vanessa Noble is a 21 y.o. G1P0 at [redacted]w[redacted]d being seen today for ongoing prenatal care.  She is currently monitored for the following issues for this low-risk pregnancy and has Dysmenorrhea in adolescent; Acne vulgaris; Dyshydrosis; Supervision of normal first pregnancy; and LGSIL on Pap smear of cervix on their problem list.  Patient reports occasional contractions.  Contractions: Irregular. Vag. Bleeding: None.  Movement: Present. Denies leaking of fluid.   The following portions of the patient's history were reviewed and updated as appropriate: allergies, current medications, past family history, past medical history, past social history, past surgical history and problem list.   Objective:   Vitals:   03/17/22 1400  BP: 124/73  Pulse: 74  Weight: 161 lb (73 kg)    Fetal Status: Fetal Heart Rate (bpm): 145 Fundal Height: 36 cm Movement: Present  Presentation: Vertex  General:  Alert, oriented and cooperative. Patient is in no acute distress.  Skin: Skin is warm and dry. No rash noted.   Cardiovascular: Normal heart rate noted  Respiratory: Normal respiratory effort, no problems with respiration noted  Abdomen: Soft, gravid, appropriate for gestational age.  Pain/Pressure: Absent     Pelvic: Cervical exam performed in the presence of a chaperone Dilation: 1 Effacement (%): 50 Station: -2  Extremities: Normal range of motion.  Edema: Trace  Mental Status: Normal mood and affect. Normal behavior. Normal judgment and thought content.   Assessment and Plan:  Pregnancy: G1P0 at [redacted]w[redacted]d 1. Encounter for supervision of normal first pregnancy in third trimester --Anticipatory guidance about next visits/weeks of pregnancy given.  --Reviewed labor readiness with patient including the [place], evening primrose oil, and raspberry leaf tea.    2. [redacted] weeks gestation of pregnancy   Term labor symptoms and general obstetric precautions including  but not limited to vaginal bleeding, contractions, leaking of fluid and fetal movement were reviewed in detail with the patient. Please refer to After Visit Summary for other counseling recommendations.   Return in about 1 week (around 03/24/2022) for LOB, Any provider.  Future Appointments  Date Time Provider [place]  03/24/2022  8:55 AM Johnston Ebbs, NP [facility] None    Fatima Blank, CNM

## 2022-03-24 ENCOUNTER — Ambulatory Visit (INDEPENDENT_AMBULATORY_CARE_PROVIDER_SITE_OTHER): Payer: Medicaid Other | Admitting: Student

## 2022-03-24 VITALS — BP 121/76 | HR 76 | Wt 160.0 lb

## 2022-03-24 DIAGNOSIS — Z3A39 39 weeks gestation of pregnancy: Secondary | ICD-10-CM

## 2022-03-24 DIAGNOSIS — Z3403 Encounter for supervision of normal first pregnancy, third trimester: Secondary | ICD-10-CM

## 2022-03-24 NOTE — Progress Notes (Addendum)
ROB, reports no problems Today.  Patient wants her membranes swept .

## 2022-03-24 NOTE — Progress Notes (Signed)
   PRENATAL VISIT NOTE  Subjective:  Vanessa Noble is a 21 y.o. G1P0 at [redacted]w[redacted]d being seen today for ongoing prenatal care.  She is currently monitored for the following issues for this low-risk pregnancy and has Dysmenorrhea in adolescent; Acne vulgaris; Dyshydrosis; Supervision of normal first pregnancy; and LGSIL on Pap smear of cervix on their problem list.  Patient reports no complaints.  Contractions: Not present. Vag. Bleeding: None.  Movement: Present. Denies leaking of fluid.   The following portions of the patient's history were reviewed and updated as appropriate: allergies, current medications, past family history, past medical history, past social history, past surgical history and problem list.   Objective:   Vitals:   03/24/22 0904  BP: 121/76  Pulse: 76  Weight: 160 lb (72.6 kg)    Fetal Status: Fetal Heart Rate (bpm): 146 Fundal Height: 37 cm Movement: Present     General:  Alert, oriented and cooperative. Patient is in no acute distress.  Skin: Skin is warm and dry. No rash noted.   Cardiovascular: Normal heart rate noted  Respiratory: Normal respiratory effort, no problems with respiration noted  Abdomen: Soft, gravid, appropriate for gestational age.  Pain/Pressure: Absent     Pelvic: Cervical exam performed in the presence of a chaperone  Dilation : 2 Effacement(%); 50 Station: -2       Extremities: Normal range of motion.  Edema: None  Mental Status: Normal mood and affect. Normal behavior. Normal judgment and thought content.   Assessment and Plan:  Pregnancy: G1P0 at [redacted]w[redacted]d 1. Encounter for supervision of normal first pregnancy in third trimester -Doing well, ready for baby to come -Encouraged to continue walking and using raspberry leaf tea  2. [redacted] weeks gestation of pregnancy -Membranes swept at patient's request -request for eIOL, paperwork signed  Term labor symptoms and general obstetric precautions including but not limited to vaginal  bleeding, contractions, leaking of fluid and fetal movement were reviewed in detail with the patient.  Please refer to After Visit Summary for other counseling recommendations.   No follow-ups on file.  No future appointments.  Johnston Ebbs, NP

## 2022-03-25 ENCOUNTER — Telehealth (HOSPITAL_COMMUNITY): Payer: Self-pay | Admitting: *Deleted

## 2022-03-25 ENCOUNTER — Other Ambulatory Visit: Payer: Self-pay | Admitting: Advanced Practice Midwife

## 2022-03-25 ENCOUNTER — Encounter (HOSPITAL_COMMUNITY): Payer: Self-pay | Admitting: *Deleted

## 2022-03-25 DIAGNOSIS — Z3490 Encounter for supervision of normal pregnancy, unspecified, unspecified trimester: Secondary | ICD-10-CM

## 2022-03-25 NOTE — Telephone Encounter (Signed)
Preadmission screen  

## 2022-03-26 ENCOUNTER — Inpatient Hospital Stay (HOSPITAL_COMMUNITY)
Admission: AD | Admit: 2022-03-26 | Discharge: 2022-03-28 | DRG: 807 | Disposition: A | Discharge status: Home / Self Care | Payer: Medicaid Other | Attending: Obstetrics and Gynecology | Admitting: Obstetrics and Gynecology

## 2022-03-26 ENCOUNTER — Other Ambulatory Visit: Payer: Self-pay

## 2022-03-26 ENCOUNTER — Inpatient Hospital Stay (HOSPITAL_COMMUNITY): Payer: Medicaid Other | Admitting: Anesthesiology

## 2022-03-26 ENCOUNTER — Encounter (HOSPITAL_COMMUNITY): Payer: Self-pay | Admitting: Obstetrics and Gynecology

## 2022-03-26 ENCOUNTER — Inpatient Hospital Stay (HOSPITAL_COMMUNITY): Payer: Medicaid Other

## 2022-03-26 DIAGNOSIS — R87612 Low grade squamous intraepithelial lesion on cytologic smear of cervix (LGSIL): Secondary | ICD-10-CM | POA: Diagnosis present

## 2022-03-26 DIAGNOSIS — O480 Post-term pregnancy: Secondary | ICD-10-CM | POA: Diagnosis present

## 2022-03-26 DIAGNOSIS — Z3A40 40 weeks gestation of pregnancy: Secondary | ICD-10-CM

## 2022-03-26 DIAGNOSIS — O3443 Maternal care for other abnormalities of cervix, third trimester: Secondary | ICD-10-CM | POA: Diagnosis present

## 2022-03-26 DIAGNOSIS — Z3490 Encounter for supervision of normal pregnancy, unspecified, unspecified trimester: Principal | ICD-10-CM

## 2022-03-26 LAB — CBC
HCT: 32.4 % — ABNORMAL LOW (ref 36.0–46.0)
Hemoglobin: 10.5 g/dL — ABNORMAL LOW (ref 12.0–15.0)
MCH: 26.7 pg (ref 26.0–34.0)
MCHC: 32.4 g/dL (ref 30.0–36.0)
MCV: 82.4 fL (ref 80.0–100.0)
Platelets: 314 10*3/uL (ref 150–400)
RBC: 3.93 MIL/uL (ref 3.87–5.11)
RDW: 14.8 % (ref 11.5–15.5)
WBC: 11.7 10*3/uL — ABNORMAL HIGH (ref 4.0–10.5)
nRBC: 0 % (ref 0.0–0.2)

## 2022-03-26 LAB — TYPE AND SCREEN
ABO/RH(D): O POS
Antibody Screen: NEGATIVE

## 2022-03-26 MED ORDER — OXYTOCIN BOLUS FROM INFUSION
333.0000 mL | Freq: Once | INTRAVENOUS | Status: AC
  Administered 2022-03-26: 333 mL via INTRAVENOUS

## 2022-03-26 MED ORDER — ONDANSETRON HCL 4 MG/2ML IJ SOLN: 4.0000 mg | Freq: Four times a day (QID) | INTRAMUSCULAR | Status: DC | PRN

## 2022-03-26 MED ORDER — DIBUCAINE (PERIANAL) 1 % EX OINT
1.0000 | TOPICAL_OINTMENT | CUTANEOUS | Status: DC | PRN
Start: 2022-03-26 — End: 2022-03-28

## 2022-03-26 MED ORDER — SODIUM CHLORIDE 0.9 % IV SOLN: 250.0000 mL | INTRAVENOUS | Status: DC | PRN

## 2022-03-26 MED ORDER — ACETAMINOPHEN 325 MG PO TABS: 650.0000 mg | ORAL_TABLET | ORAL | Status: DC | PRN

## 2022-03-26 MED ORDER — FENTANYL-BUPIVACAINE-NACL 0.5-0.125-0.9 MG/250ML-% EP SOLN
12.0000 mL/h | EPIDURAL | Status: DC | PRN
  Administered 2022-03-26: 12 mL/h via EPIDURAL
  Filled 2022-03-26: qty 250

## 2022-03-26 MED ORDER — SENNOSIDES-DOCUSATE SODIUM 8.6-50 MG PO TABS
2.0000 | ORAL_TABLET | ORAL | Status: DC
  Administered 2022-03-27 – 2022-03-28 (×2): 2 via ORAL
  Filled 2022-03-26 (×2): qty 2

## 2022-03-26 MED ORDER — SIMETHICONE 80 MG PO CHEW: 80.0000 mg | CHEWABLE_TABLET | ORAL | Status: DC | PRN

## 2022-03-26 MED ORDER — ZOLPIDEM TARTRATE 5 MG PO TABS: 5.0000 mg | ORAL_TABLET | Freq: Every evening | ORAL | Status: DC | PRN

## 2022-03-26 MED ORDER — PHENYLEPHRINE 80 MCG/ML (10ML) SYRINGE FOR IV PUSH (FOR BLOOD PRESSURE SUPPORT): 80.0000 ug | PREFILLED_SYRINGE | INTRAVENOUS | Status: DC | PRN

## 2022-03-26 MED ORDER — LACTATED RINGERS IV SOLN: 500.0000 mL | Freq: Once | INTRAVENOUS | Status: DC

## 2022-03-26 MED ORDER — SODIUM CHLORIDE 0.9% FLUSH: 3.0000 mL | Freq: Two times a day (BID) | INTRAVENOUS | Status: DC

## 2022-03-26 MED ORDER — TETANUS-DIPHTH-ACELL PERTUSSIS 5-2.5-18.5 LF-MCG/0.5 IM SUSY: 0.5000 mL | PREFILLED_SYRINGE | Freq: Once | INTRAMUSCULAR | Status: DC

## 2022-03-26 MED ORDER — FENTANYL CITRATE (PF) 100 MCG/2ML IJ SOLN
100.0000 ug | INTRAMUSCULAR | Status: DC | PRN
  Administered 2022-03-26 (×2): 100 ug via INTRAVENOUS
  Filled 2022-03-26 (×2): qty 2

## 2022-03-26 MED ORDER — DIPHENHYDRAMINE HCL 50 MG/ML IJ SOLN: 12.5000 mg | INTRAMUSCULAR | Status: DC | PRN

## 2022-03-26 MED ORDER — EPHEDRINE 5 MG/ML INJ: 10.0000 mg | INTRAVENOUS | Status: DC | PRN

## 2022-03-26 MED ORDER — LIDOCAINE HCL (PF) 1 % IJ SOLN: 30.0000 mL | INTRAMUSCULAR | Status: DC | PRN

## 2022-03-26 MED ORDER — BENZOCAINE-MENTHOL 20-0.5 % EX AERO
1.0000 "application " | INHALATION_SPRAY | CUTANEOUS | Status: DC | PRN
  Filled 2022-03-26: qty 56

## 2022-03-26 MED ORDER — ONDANSETRON HCL 4 MG/2ML IJ SOLN: 4.0000 mg | INTRAMUSCULAR | Status: DC | PRN

## 2022-03-26 MED ORDER — MEASLES, MUMPS & RUBELLA VAC IJ SOLR: 0.5000 mL | Freq: Once | INTRAMUSCULAR | Status: DC

## 2022-03-26 MED ORDER — LIDOCAINE HCL (PF) 1 % IJ SOLN
INTRAMUSCULAR | Status: DC | PRN
  Administered 2022-03-26: 6 mL via EPIDURAL
  Administered 2022-03-26: 4 mL via EPIDURAL

## 2022-03-26 MED ORDER — LACTATED RINGERS IV SOLN: 500.0000 mL | INTRAVENOUS | Status: DC | PRN

## 2022-03-26 MED ORDER — MISOPROSTOL 25 MCG QUARTER TABLET
25.0000 ug | ORAL_TABLET | ORAL | Status: DC | PRN
  Administered 2022-03-26: 25 ug via VAGINAL
  Filled 2022-03-26: qty 1

## 2022-03-26 MED ORDER — DIPHENHYDRAMINE HCL 25 MG PO CAPS: 25.0000 mg | ORAL_CAPSULE | Freq: Four times a day (QID) | ORAL | Status: DC | PRN

## 2022-03-26 MED ORDER — OXYCODONE-ACETAMINOPHEN 5-325 MG PO TABS: 2.0000 | ORAL_TABLET | ORAL | Status: DC | PRN

## 2022-03-26 MED ORDER — WITCH HAZEL-GLYCERIN EX PADS: 1.0000 "application " | MEDICATED_PAD | CUTANEOUS | Status: DC | PRN

## 2022-03-26 MED ORDER — SODIUM CHLORIDE 0.9% FLUSH: 3.0000 mL | INTRAVENOUS | Status: DC | PRN

## 2022-03-26 MED ORDER — IBUPROFEN 600 MG PO TABS
600.0000 mg | ORAL_TABLET | Freq: Four times a day (QID) | ORAL | Status: DC
  Administered 2022-03-26 – 2022-03-28 (×8): 600 mg via ORAL
  Filled 2022-03-26 (×8): qty 1

## 2022-03-26 MED ORDER — SOD CITRATE-CITRIC ACID 500-334 MG/5ML PO SOLN: 30.0000 mL | ORAL | Status: DC | PRN

## 2022-03-26 MED ORDER — TERBUTALINE SULFATE 1 MG/ML IJ SOLN: 0.2500 mg | Freq: Once | INTRAMUSCULAR | Status: DC | PRN

## 2022-03-26 MED ORDER — TRANEXAMIC ACID-NACL 1000-0.7 MG/100ML-% IV SOLN
INTRAVENOUS | Status: AC
  Filled 2022-03-26: qty 100

## 2022-03-26 MED ORDER — LACTATED RINGERS IV SOLN: INTRAVENOUS | Status: DC

## 2022-03-26 MED ORDER — PRENATAL MULTIVITAMIN CH
1.0000 | ORAL_TABLET | Freq: Every day | ORAL | Status: DC
  Administered 2022-03-27 – 2022-03-28 (×2): 1 via ORAL
  Filled 2022-03-26 (×2): qty 1

## 2022-03-26 MED ORDER — COCONUT OIL OIL: 1.0000 "application " | TOPICAL_OIL | Status: DC | PRN

## 2022-03-26 MED ORDER — OXYTOCIN-SODIUM CHLORIDE 30-0.9 UT/500ML-% IV SOLN
2.5000 [IU]/h | INTRAVENOUS | Status: DC
  Filled 2022-03-26: qty 500

## 2022-03-26 MED ORDER — ONDANSETRON HCL 4 MG PO TABS: 4.0000 mg | ORAL_TABLET | ORAL | Status: DC | PRN

## 2022-03-26 MED ORDER — OXYCODONE-ACETAMINOPHEN 5-325 MG PO TABS: 1.0000 | ORAL_TABLET | ORAL | Status: DC | PRN

## 2022-03-26 MED ORDER — TRANEXAMIC ACID-NACL 1000-0.7 MG/100ML-% IV SOLN
1000.0000 mg | INTRAVENOUS | Status: AC
  Administered 2022-03-26: 1000 mg via INTRAVENOUS

## 2022-03-26 NOTE — H&P (Signed)
LABOR AND DELIVERY ADMISSION HISTORY AND PHYSICAL NOTE  Vanessa Noble is a 21 y.o. female G1P0 with IUP at 53w1dby 12 week [place].  She reports positive fetal movement. She denies leakage of fluid or vaginal bleeding.  [facility]:  - LSIL on pap smear, needs repeat in one year   Past Medical History: Past Medical History:  Diagnosis Date   Medical history non-contributory     Past Surgical History: No past surgical history on file.  Obstetrical History: OB History     Gravida  1   Para      Term      Preterm      AB      Living         SAB      IAB      Ectopic      Multiple      Live Births              Social History: Social History   Socioeconomic History   Marital status: Single    Spouse name: Not on file   Number of children: Not on file   Years of education: Not on file   Highest education level: Not on file  Occupational History   Not on file  Tobacco Use   Smoking status: Never   Smokeless tobacco: Never  Vaping Use   Vaping Use: Never used  Substance and Sexual Activity   Alcohol use: Not Currently    Comment: not since confirmed pregnancy   Drug use: Never   Sexual activity: Yes    Partners: Male    Birth control/protection: None  Other Topics Concern   Not on file  Social History Narrative   Lives with mother, father, and 2 brothers.   Social Determinants of Health   Financial Resource Strain: Not on file  Food Insecurity: Not on file  Transportation Needs: Not on file  Physical Activity: Not on file  Stress: Not on file  Social Connections: Not on file    Family History: Family History  Problem Relation Age of Onset   Hypertension Father    Diabetes Father     Allergies: Allergies  Allergen Reactions   Penicillins Hives    Medications Prior to Admission  Medication Sig Dispense Refill Last Dose   Blood Pressure  Monitoring (BLOOD PRESSURE KIT) DEVI 1 kit by Does not apply route once a week. 1 each 0    Prenatal Vit-Fe Fumarate-FA (MULTIVITAMIN-PRENATAL) 27-0.8 MG TABS tablet Take 1 tablet by mouth daily at 12 noon.      Prenatal-Fe Fum-Methf-FA w/o A (VITAFOL-NANO) 18-0.6-0.4 MG TABS Take 1 tablet by mouth daily. 30 tablet 12      Review of Systems  All systems reviewed and negative except as stated in HPI  Physical Exam Blood pressure 114/74, pulse 77, height 5' 3"  (1.6 m), weight 72.6 kg, last menstrual period 05/20/2021. General appearance: alert, oriented, NAD Lungs: normal respiratory effort Heart: regular rate Abdomen: soft, non-tender; gravid, FH appropriate for GA Extremities: No calf swelling or tenderness Presentation: cephalic Fetal monitoring: 145/mod/15x15/none Uterine activity: irregular Dilation: 2 Effacement (%): 80 Station: -1 Exam by:: lee  Prenatal labs: ABO, Rh: --/--/PENDING (06/01 01497 Antibody: PENDING (06/01 0748) Rubella: 1.74 (11/22 1514) RPR: Non Reactive (03/10 1020)  HBsAg: Negative (11/22 1514)  HIV: Non Reactive (03/10 1020)  GC/Chlamydia: neg GBS: Negative/-- (05/09 0946)  2-hr GTT: passed Genetic screening:  LR female Anatomy US: normal   Prenatal Transfer Tool  Maternal Diabetes: No Genetic Screening: Normal Maternal Ultrasounds/Referrals: Normal Fetal Ultrasounds or other Referrals:  None Maternal Substance Abuse:  No Significant Maternal Medications:  None Significant Maternal Lab Results: Group B Strep negative  Results for orders placed or performed during the hospital encounter of 03/26/22 (from the past 24 hour(s))  Type and screen   Collection Time: 03/26/22  7:48 AM  Result Value Ref Range   ABO/RH(D) PENDING    Antibody Screen PENDING    Sample Expiration      03/29/2022,2359 Performed at [facility], [place] [address] [place], [place] [address]     Patient Active Problem List   Diagnosis Date Noted   Encounter for  elective induction of labor 03/26/2022   LGSIL on Pap smear of cervix 09/26/2021   Supervision of normal first pregnancy 09/09/2021   Dyshydrosis 08/27/2016   Dysmenorrhea in adolescent 07/02/2015   Acne vulgaris 07/02/2015    Assessment: Vanessa Noble is a 21 y.o. G1P0 at 5w1dhere for eIOL.  #Labor: Started with cytotec-- may place FB on next check if still needed.  #Pain: PRN, planning for epidural  #FWB: Cat I  #ID: GBS Negative #MOF: Formula  #MOC: Pills  #Circ: yes inpatient   SPatriciaann Clan6/10/2021, 8:25 AM

## 2022-03-26 NOTE — Discharge Summary (Signed)
Postpartum Discharge Summary      Patient Name: Vanessa Noble DOB: Jul 29, 2001 MRN: [id number]  Date of admission: 03/26/2022 Delivery date:03/26/2022  Delivering provider: Patriciaann Clan  Date of discharge: 03/28/2022  Admitting diagnosis: Encounter for elective induction of labor [Z34.90] Intrauterine pregnancy: [redacted]w[redacted]d    Secondary diagnosis:  Principal Problem:   Encounter for elective induction of labor Active Problems:   LGSIL on Pap smear of cervix  Additional problems: none    Discharge diagnosis: Term Pregnancy Delivered                                              Post partum procedures: none Augmentation: AROM and Cytotec Complications: None  Hospital course: Induction of Labor With Vaginal Delivery   21y.o. yo G1P1001 at 453w1das admitted to the hospital 03/26/2022 for induction of labor.  Indication for induction: Elective.  Patient had an uncomplicated labor course as follows: Membrane Rupture Time/Date: 1:10 PM ,03/26/2022   Delivery Method:Vaginal, Spontaneous  Episiotomy: None  Lacerations:  2nd degree;Periurethral;Perineal  Details of delivery can be found in separate delivery note.  Patient had a routine postpartum course. Patient is discharged home 03/28/22.  Newborn Data: Birth date:03/26/2022  Birth time:3:04 PM  Gender:Female  Living status:Living  Apgars:9 ,9  Weight:3260 g (7lb 3oz)  Magnesium Sulfate received: No BMZ received: No Rhophylac:N/A MMR:N/A T-DaP:Given prenatally Flu: No Transfusion:No  Physical exam  Vitals:   03/27/22 0500 03/27/22 1300 03/27/22 1949 03/28/22 0516  BP: 111/73 117/69 115/68 107/71  Pulse: 77 68 75 77  Resp: 16 15 16 18   Temp: 97.8 F (36.6 C) 98 F (36.7 C) 98 F (36.7 C) 97.8 F (36.6 C)  TempSrc: Oral Oral Oral Oral  SpO2:  100%  100%  Weight:      Height:       General: alert and cooperative Lochia: appropriate Uterine Fundus: firm Incision: N/A DVT Evaluation: No evidence of DVT seen on  physical exam. Labs: Lab Results  Component Value Date   WBC 11.7 (H) 03/27/2022   HGB 8.4 (L) 03/27/2022   HCT 26.0 (L) 03/27/2022   MCV 82.0 03/27/2022   PLT 245 03/27/2022       View : No data to display.         Edinburgh Score:     View : No data to display.           After visit meds:  Allergies as of 03/28/2022       Reactions   Penicillins Hives        Medication List     STOP taking these medications    Blood Pressure Kit Devi   multivitamin-prenatal 27-0.8 MG Tabs tablet       TAKE these medications    ibuprofen 600 MG tablet Commonly known as: ADVIL Take 1 tablet (600 mg total) by mouth every 6 (six) hours as needed.   Vitafol-Nano 18-0.6-0.4 MG Tabs Take 1 tablet by mouth daily.         Discharge home in stable condition Infant Feeding: Bottle Infant Disposition:home with mother Discharge instruction: per After Visit Summary and Postpartum booklet. Activity: Advance as tolerated. Pelvic rest for 6 weeks.  Diet: routine diet Future Appointments: Future Appointments  Date Time Provider [place]7/13/2023 10:15 AM BaGriffin BasilMD [facility] None  Follow up Visit:  Message sent to [facility] by Dr Higinio Plan:  Please schedule this patient for a In person postpartum visit in 6 weeks with the following provider: Any provider. Additional Postpartum F/U: None   Low risk pregnancy complicated by:  None Delivery mode:  Vaginal, Spontaneous  Anticipated Birth Control:   POP vs OCPs   03/28/2022 Myrtis Ser, CNM 8:19 AM

## 2022-03-26 NOTE — Anesthesia Preprocedure Evaluation (Signed)
Anesthesia Evaluation  Patient identified by MRN, date of birth, ID band Patient awake    Reviewed: Allergy & Precautions, H&P , NPO status , Patient's Chart, lab work & pertinent test results  History of Anesthesia Complications Negative for: history of anesthetic complications  Airway Mallampati: II  TM Distance: >3 FB     Dental   Pulmonary neg pulmonary ROS,    Pulmonary exam normal        Cardiovascular negative cardio ROS   Rhythm:regular Rate:Normal     Neuro/Psych negative neurological ROS  negative psych ROS   GI/Hepatic negative GI ROS, Neg liver ROS,   Endo/Other  negative endocrine ROS  Renal/GU negative Renal ROS  negative genitourinary   Musculoskeletal negative musculoskeletal ROS (+)   Abdominal   Peds  Hematology negative hematology ROS (+)   Anesthesia Other Findings   Reproductive/Obstetrics (+) Pregnancy                             Anesthesia Physical Anesthesia Plan  ASA: 2  Anesthesia Plan: Epidural   Post-op Pain Management:    Induction:   PONV Risk Score and Plan:   Airway Management Planned:   Additional Equipment:   Intra-op Plan:   Post-operative Plan:   Informed Consent: I have reviewed the patients History and Physical, chart, labs and discussed the procedure including the risks, benefits and alternatives for the proposed anesthesia with the patient or authorized representative who has indicated his/her understanding and acceptance.       Plan Discussed with:   Anesthesia Plan Comments:         Anesthesia Quick Evaluation

## 2022-03-26 NOTE — Progress Notes (Signed)
Labor Progress Note Vanessa Noble is a 21 y.o. G1P0 at [redacted]w[redacted]d presented for eIOL.   S: Doing well, comfortable after epidural placement. Checked by RN about 1.5 hours ago and 6cm then.   O:  BP 120/67   Pulse 81   Temp 98 F (36.7 C) (Oral)   Resp 16   Ht 5\' 3"  (1.6 m)   Wt 72.6 kg   LMP 05/20/2021   BMI 28.34 kg/m  EFM: 145/mod/15x15/occasional variable   CVE: Dilation: Lip/rim Effacement (%): 100 Station: -1 Presentation: Vertex Exam by:: Bernardino Dowell   A&P: 21 y.o. G1P0 [redacted]w[redacted]d  #Labor: Progressed very well into spontaneous labor s/p one cytotec this morning. After verbal consent, performed AROM with clear fluid. Suspect she will be complete soon and labor down for now given -1 station and currently not feeling any discomfort.  #Pain: Epidural  #FWB: Cat I  #GBS negative   [redacted]w[redacted]d, DO 1:58 PM

## 2022-03-26 NOTE — Anesthesia Procedure Notes (Signed)
Epidural Patient location during procedure: OB Start time: 03/26/2022 11:08 AM End time: 03/26/2022 11:18 AM  Staffing Anesthesiologist: Lucretia Kern, MD Performed: anesthesiologist   Preanesthetic Checklist Completed: patient identified, IV checked, risks and benefits discussed, monitors and equipment checked, pre-op evaluation and timeout performed  Epidural Patient position: sitting Prep: DuraPrep Patient monitoring: heart rate, continuous pulse ox and blood pressure Approach: midline Location: L3-L4 Injection technique: LOR air  Needle:  Needle type: Tuohy  Needle gauge: 17 G Needle length: 9 cm Needle insertion depth: 6 cm Catheter type: closed end flexible Catheter size: 19 Gauge Catheter at skin depth: 11 cm Test dose: negative  Assessment Events: blood not aspirated, injection not painful, no injection resistance, no paresthesia and negative IV test  Additional Notes Reason for block:procedure for pain

## 2022-03-27 LAB — CBC
HCT: 26 % — ABNORMAL LOW (ref 36.0–46.0)
Hemoglobin: 8.4 g/dL — ABNORMAL LOW (ref 12.0–15.0)
MCH: 26.5 pg (ref 26.0–34.0)
MCHC: 32.3 g/dL (ref 30.0–36.0)
MCV: 82 fL (ref 80.0–100.0)
Platelets: 245 10*3/uL (ref 150–400)
RBC: 3.17 MIL/uL — ABNORMAL LOW (ref 3.87–5.11)
RDW: 15.1 % (ref 11.5–15.5)
WBC: 11.7 10*3/uL — ABNORMAL HIGH (ref 4.0–10.5)
nRBC: 0 % (ref 0.0–0.2)

## 2022-03-27 LAB — RPR: RPR Ser Ql: NONREACTIVE

## 2022-03-27 NOTE — Anesthesia Postprocedure Evaluation (Signed)
Anesthesia Post Note  Patient: Vanessa Noble  Procedure(s) Performed: AN AD HOC LABOR EPIDURAL     Patient location during evaluation: Mother Baby Anesthesia Type: Epidural Level of consciousness: awake and alert Pain management: pain level controlled Vital Signs Assessment: post-procedure vital signs reviewed and stable Respiratory status: spontaneous breathing, nonlabored ventilation and respiratory function stable Cardiovascular status: stable Postop Assessment: no headache, no backache, epidural receding, no apparent nausea or vomiting, patient able to bend at knees, adequate PO intake and able to ambulate Anesthetic complications: no   No notable events documented.  Last Vitals:  Vitals:   03/27/22 0153 03/27/22 0500  BP: 113/74 111/73  Pulse: 86 77  Resp: 17 16  Temp: 37 C 36.6 C    Last Pain:  Vitals:   03/27/22 0710  TempSrc:   PainSc: 0-No pain   Pain Goal:                   [employer]

## 2022-03-27 NOTE — Progress Notes (Signed)
Post Partum Day 1 Subjective: no complaints, up ad lib, voiding and tolerating PO, small lochia, plans to bottle feed, oral contraceptives (estrogen/progesterone)  Objective: Blood pressure 111/73, pulse 77, temperature 97.8 F (36.6 C), temperature source Oral, resp. rate 16, height 5\' 3"  (1.6 m), weight 72.6 kg, last menstrual period 05/20/2021, unknown if currently breastfeeding.  Physical Exam:  General: alert, cooperative and no distress Lochia:normal flow Chest: CTAB Heart: RRR no m/r/g Abdomen: +BS, soft, nontender,  Uterine Fundus: firm DVT Evaluation: No evidence of DVT seen on physical exam. Extremities: no edema  Recent Labs    03/26/22 0748 03/27/22 0457  HGB 10.5* 8.4*  HCT 32.4* 26.0*    Assessment/Plan: Plan for discharge tomorrow and Circumcision prior to discharge   LOS: 1 day   Vanessa Noble 03/27/2022, 7:30 AM

## 2022-03-27 NOTE — Progress Notes (Signed)
Circumcision Consent  Discussed with mom at bedside about circumcision.   Circumcision is a surgery that removes the skin that covers the tip of the penis, called the "foreskin." Circumcision is usually done when a boy is between 1 and 10 days old, sometimes up to 3-4 weeks old.  The most common reasons boys are circumcised include for cultural/religious beliefs or for parental preference (potentially easier to clean, so baby looks like daddy, etc).  There may be some medical benefits for circumcision:   Circumcised boys seem to have slightly lower rates of: ? Urinary tract infections (per the American Academy of Pediatrics an uncircumcised boy has a 1/100 chance of developing a UTI in the first year of life, a circumcised boy at a 10/998 chance of developing a UTI in the first year of life- a 10% reduction) ? Penis cancer (typically rare- an uncircumcised female has a 1 in 100,000 chance of developing cancer of the penis) ? Sexually transmitted infection (in endemic areas, including HIV, HPV and Herpes- circumcision does NOT protect against gonorrhea, chlamydia, trachomatis, or syphilis) ? Phimosis: a condition where that makes retraction of the foreskin over the glans impossible (0.4 per 1000 boys per year or 0.6% of boys are affected by their 15th birthday)  Boys and men who are not circumcised can reduce these extra risks by: ? Cleaning their penis well ? Using condoms during sex  What are the risks of circumcision?  As with any surgical procedure, there are risks and complications. In circumcision, complications are rare and usually minor, the most common being: ? Bleeding- risk is reduced by holding each clamp for 30 seconds prior to a cut being made, and by holding pressure after the procedure is done ? Infection- the penis is cleaned prior to the procedure, and the procedure is done under sterile technique ? Damage to the urethra or amputation of the penis  How is circumcision done  in baby boys?  The baby will be placed on a special table and the legs restrained for their safety. Numbing medication is injected into the penis, and the skin is cleansed with betadine to decrease the risk of infection.   What to expect:  The penis will look red and raw for 5-7 days as it heals. We expect scabbing around where the cut was made, as well as clear-pink fluid and some swelling of the penis right after the procedure. If your baby's circumcision starts to bleed or develops pus, please contact your pediatrician immediately.  All questions were answered and mother consented.  Majestic Molony Cresenzo-Dishmon Obstetrics Fellow  

## 2022-03-28 MED ORDER — IBUPROFEN 600 MG PO TABS: 600.0000 mg | ORAL_TABLET | Freq: Four times a day (QID) | ORAL | 0 refills | Status: AC | PRN

## 2022-04-06 ENCOUNTER — Telehealth (HOSPITAL_COMMUNITY): Payer: Self-pay | Admitting: *Deleted

## 2022-04-06 NOTE — Telephone Encounter (Signed)
Voicemail not setup. Unable to leave message.  Duffy Rhody, RN 04-06-2022 at 11:37am

## 2022-05-07 ENCOUNTER — Ambulatory Visit (INDEPENDENT_AMBULATORY_CARE_PROVIDER_SITE_OTHER): Payer: Medicaid Other | Admitting: Obstetrics and Gynecology

## 2022-05-07 ENCOUNTER — Other Ambulatory Visit (HOSPITAL_COMMUNITY)
Admission: RE | Admit: 2022-05-07 | Discharge: 2022-05-07 | Disposition: A | Discharge status: Home / Self Care | Payer: Medicaid Other | Source: Ambulatory Visit | Attending: Obstetrics and Gynecology | Admitting: Obstetrics and Gynecology

## 2022-05-07 DIAGNOSIS — R87612 Low grade squamous intraepithelial lesion on cytologic smear of cervix (LGSIL): Secondary | ICD-10-CM

## 2022-05-07 DIAGNOSIS — Z30011 Encounter for initial prescription of contraceptive pills: Secondary | ICD-10-CM

## 2022-05-07 MED ORDER — NORETHIN ACE-ETH ESTRAD-FE 1-20 MG-MCG PO TABS: 1.0000 | ORAL_TABLET | Freq: Every day | ORAL | 11 refills | Status: AC

## 2022-05-07 NOTE — Progress Notes (Signed)
Post Partum Visit Note  Vanessa Noble is a 21 y.o. G43P1001 female who presents for a postpartum visit. She is 6 weeks postpartum following a normal spontaneous vaginal delivery.  I have fully reviewed the prenatal and intrapartum course. The delivery was at 40.1 gestational weeks.  Anesthesia: epidural. Postpartum course has been uncomplicated. Baby is doing well. Baby is feeding by bottle - Gerber Gentle . Bleeding no bleeding. Bowel function is normal. Bladder function is normal. Patient is not sexually active. Contraception method is abstinence.   Postpartum depression screening: negative, score 0.   The pregnancy intention screening data noted above was reviewed. Potential methods of contraception were discussed. The patient elected to proceed with No data recorded.   Edinburgh Postnatal Depression Scale - 05/07/22 1032       Edinburgh Postnatal Depression Scale:  In the Past 7 Days   I have been able to laugh and see the funny side of things. 0    I have looked forward with enjoyment to things. 0    I have blamed myself unnecessarily when things went wrong. 0    I have been anxious or worried for no good reason. 0    I have felt scared or panicky for no good reason. 0    Things have been getting on top of me. 0    I have been so unhappy that I have had difficulty sleeping. 0    I have felt sad or miserable. 0    I have been so unhappy that I have been crying. 0    The thought of harming myself has occurred to me. 0    Edinburgh Postnatal Depression Scale Total 0             There are no preventive care reminders to display for this patient.  The following portions of the patient's history were reviewed and updated as appropriate: allergies, current medications, past family history, past medical history, past social history, past surgical history, and problem list.  Review of Systems Pertinent items are noted in HPI.  Objective:  BP 114/75   Pulse 71   Wt  144 lb (65.3 kg)   LMP 05/20/2021   Breastfeeding No   BMI 25.51 kg/m    General:  alert, cooperative, and no distress   Breasts:  not indicated  Lungs: clear to auscultation bilaterally  Heart:  regular rate and rhythm  Abdomen: soft, non-tender; bowel sounds normal; no masses,  no organomegaly   Wound N/a  GU exam:   Laceration repair well healed, vagina WNL, pap taken with chaperone present       Assessment:    Encounter for postpartum care  normal postpartum exam.   Plan:   Essential components of care per ACOG recommendations:  1.  Mood and well being: Patient with negative depression screening today. Reviewed local resources for support.  - Patient tobacco use? No.   - hx of drug use? No.    2. Infant care and feeding:  -Patient currently breastmilk feeding? No.  -Social determinants of health (SDOH) reviewed in EPIC. No concerns.  3. Sexuality, contraception and birth spacing - Patient does not want a pregnancy in the next year.  Desired family size is 2 children.  - Reviewed reproductive life planning. Reviewed contraceptive methods based on pt preferences and effectiveness.  Patient desired Oral Contraceptive today.   - Discussed birth spacing of 18 months  4. Sleep and fatigue -Encouraged family/partner/community support of 4  hrs of uninterrupted sleep to help with mood and fatigue  5. Physical Recovery  - Discussed patients delivery and complications. She describes her labor as good. - Patient had a Vaginal, no problems at delivery. Patient had a 2nd degree laceration. Perineal healing reviewed. Patient expressed understanding - Patient has urinary incontinence? No. - Patient is safe to resume physical and sexual activity  6.  Health Maintenance - HM due items addressed Yes - Last pap smear  Diagnosis  Date Value Ref Range Status  09/17/2021 - Low grade squamous intraepithelial lesion (LSIL) (A)  Final   Pap smear done at today's visit.  -Breast Cancer  screening indicated? No.   7. Chronic Disease/Pregnancy Condition follow up: None  - PCP follow up Pt is interested in nexplanon, but will start with OCP Warden Fillers, MD [facility], [facility]

## 2022-05-14 LAB — CYTOLOGY - PAP
Chlamydia: NEGATIVE
Comment: NEGATIVE
Comment: NORMAL
Diagnosis: NEGATIVE
Diagnosis: REACTIVE
Neisseria Gonorrhea: NEGATIVE

## 2022-07-19 ENCOUNTER — Encounter: Payer: Self-pay | Admitting: Obstetrics and Gynecology
